# Patient Record
Sex: Male | Born: 1955 | Race: White | Hispanic: No | Marital: Married | Smoking: Never smoker
Health system: Southern US, Community
[De-identification: ages and names within clinical notes are randomized; demographics above are authoritative.]

## PROBLEM LIST (undated history)

## (undated) DIAGNOSIS — R0602 Shortness of breath: Secondary | ICD-10-CM

## (undated) DIAGNOSIS — G43909 Migraine, unspecified, not intractable, without status migrainosus: Secondary | ICD-10-CM

## (undated) DIAGNOSIS — F4310 Post-traumatic stress disorder, unspecified: Secondary | ICD-10-CM

## (undated) DIAGNOSIS — F419 Anxiety disorder, unspecified: Secondary | ICD-10-CM

## (undated) DIAGNOSIS — S0990XA Unspecified injury of head, initial encounter: Secondary | ICD-10-CM

## (undated) DIAGNOSIS — M1990 Unspecified osteoarthritis, unspecified site: Secondary | ICD-10-CM

## (undated) DIAGNOSIS — I951 Orthostatic hypotension: Secondary | ICD-10-CM

## (undated) DIAGNOSIS — M4800 Spinal stenosis, site unspecified: Secondary | ICD-10-CM

## (undated) DIAGNOSIS — F32A Depression, unspecified: Secondary | ICD-10-CM

## (undated) DIAGNOSIS — G8929 Other chronic pain: Secondary | ICD-10-CM

## (undated) DIAGNOSIS — F329 Major depressive disorder, single episode, unspecified: Secondary | ICD-10-CM

## (undated) DIAGNOSIS — K589 Irritable bowel syndrome without diarrhea: Secondary | ICD-10-CM

## (undated) DIAGNOSIS — R55 Syncope and collapse: Secondary | ICD-10-CM

## (undated) DIAGNOSIS — R251 Tremor, unspecified: Secondary | ICD-10-CM

## (undated) DIAGNOSIS — Z9289 Personal history of other medical treatment: Secondary | ICD-10-CM

## (undated) DIAGNOSIS — H409 Unspecified glaucoma: Secondary | ICD-10-CM

## (undated) DIAGNOSIS — T7840XA Allergy, unspecified, initial encounter: Secondary | ICD-10-CM

## (undated) DIAGNOSIS — E785 Hyperlipidemia, unspecified: Secondary | ICD-10-CM

## (undated) DIAGNOSIS — K219 Gastro-esophageal reflux disease without esophagitis: Secondary | ICD-10-CM

## (undated) DIAGNOSIS — T4145XA Adverse effect of unspecified anesthetic, initial encounter: Secondary | ICD-10-CM

## (undated) DIAGNOSIS — M797 Fibromyalgia: Secondary | ICD-10-CM

## (undated) DIAGNOSIS — T8859XA Other complications of anesthesia, initial encounter: Secondary | ICD-10-CM

## (undated) HISTORY — PX: TRIGGER FINGER RELEASE: SHX641

## (undated) HISTORY — PX: CLOSED REDUCTION SHOULDER DISLOCATION: SUR242

## (undated) HISTORY — PX: LUMBAR LAMINECTOMY: SHX95

## (undated) HISTORY — DX: Unspecified glaucoma: H40.9

## (undated) HISTORY — DX: Depression, unspecified: F32.A

## (undated) HISTORY — PX: TONSILLECTOMY: SUR1361

## (undated) HISTORY — DX: Syncope and collapse: R55

## (undated) HISTORY — PX: FINGER SURGERY: SHX640

## (undated) HISTORY — DX: Allergy, unspecified, initial encounter: T78.40XA

## (undated) HISTORY — DX: Anxiety disorder, unspecified: F41.9

## (undated) HISTORY — DX: Gastro-esophageal reflux disease without esophagitis: K21.9

## (undated) HISTORY — PX: BACK SURGERY: SHX140

## (undated) HISTORY — DX: Migraine, unspecified, not intractable, without status migrainosus: G43.909

## (undated) HISTORY — DX: Other chronic pain: G89.29

## (undated) HISTORY — DX: Hyperlipidemia, unspecified: E78.5

## (undated) HISTORY — DX: Unspecified injury of head, initial encounter: S09.90XA

## (undated) HISTORY — DX: Major depressive disorder, single episode, unspecified: F32.9

---

## 1981-10-03 HISTORY — PX: HAND NERVE REPAIR: SHX1728

## 1998-01-16 ENCOUNTER — Emergency Department (HOSPITAL_COMMUNITY): Admission: EM | Admit: 1998-01-16 | Discharge: 1998-01-16 | Payer: Self-pay | Admitting: Emergency Medicine

## 1999-04-12 ENCOUNTER — Ambulatory Visit (HOSPITAL_COMMUNITY): Admission: RE | Admit: 1999-04-12 | Discharge: 1999-04-12 | Payer: Self-pay | Admitting: Internal Medicine

## 2001-02-06 ENCOUNTER — Encounter: Admission: RE | Admit: 2001-02-06 | Discharge: 2001-05-07 | Payer: Self-pay | Admitting: Family Medicine

## 2006-10-17 ENCOUNTER — Ambulatory Visit (HOSPITAL_BASED_OUTPATIENT_CLINIC_OR_DEPARTMENT_OTHER): Admission: RE | Admit: 2006-10-17 | Discharge: 2006-10-17 | Payer: Self-pay | Admitting: Orthopedic Surgery

## 2007-07-03 ENCOUNTER — Encounter: Admission: RE | Admit: 2007-07-03 | Discharge: 2007-07-03 | Payer: Self-pay | Admitting: Family Medicine

## 2008-01-18 ENCOUNTER — Ambulatory Visit (HOSPITAL_BASED_OUTPATIENT_CLINIC_OR_DEPARTMENT_OTHER): Admission: RE | Admit: 2008-01-18 | Discharge: 2008-01-18 | Payer: Self-pay | Admitting: Orthopedic Surgery

## 2008-12-25 ENCOUNTER — Encounter: Payer: Self-pay | Admitting: Internal Medicine

## 2008-12-25 ENCOUNTER — Ambulatory Visit: Payer: Self-pay | Admitting: Internal Medicine

## 2008-12-25 DIAGNOSIS — Z91040 Latex allergy status: Secondary | ICD-10-CM

## 2008-12-31 ENCOUNTER — Ambulatory Visit: Payer: Self-pay

## 2008-12-31 ENCOUNTER — Encounter: Payer: Self-pay | Admitting: Internal Medicine

## 2009-01-12 ENCOUNTER — Telehealth (INDEPENDENT_AMBULATORY_CARE_PROVIDER_SITE_OTHER): Payer: Self-pay | Admitting: *Deleted

## 2009-01-26 DIAGNOSIS — H40009 Preglaucoma, unspecified, unspecified eye: Secondary | ICD-10-CM | POA: Insufficient documentation

## 2009-01-26 DIAGNOSIS — R519 Headache, unspecified: Secondary | ICD-10-CM | POA: Insufficient documentation

## 2009-01-26 DIAGNOSIS — F411 Generalized anxiety disorder: Secondary | ICD-10-CM | POA: Insufficient documentation

## 2009-01-26 DIAGNOSIS — R42 Dizziness and giddiness: Secondary | ICD-10-CM

## 2009-01-26 DIAGNOSIS — F329 Major depressive disorder, single episode, unspecified: Secondary | ICD-10-CM

## 2009-01-26 DIAGNOSIS — R51 Headache: Secondary | ICD-10-CM

## 2009-01-26 DIAGNOSIS — R0602 Shortness of breath: Secondary | ICD-10-CM | POA: Insufficient documentation

## 2009-01-26 DIAGNOSIS — F3289 Other specified depressive episodes: Secondary | ICD-10-CM | POA: Insufficient documentation

## 2009-01-26 DIAGNOSIS — R55 Syncope and collapse: Secondary | ICD-10-CM

## 2009-01-26 DIAGNOSIS — Z8679 Personal history of other diseases of the circulatory system: Secondary | ICD-10-CM

## 2010-02-23 ENCOUNTER — Telehealth (INDEPENDENT_AMBULATORY_CARE_PROVIDER_SITE_OTHER): Payer: Self-pay | Admitting: *Deleted

## 2010-03-23 ENCOUNTER — Ambulatory Visit (HOSPITAL_BASED_OUTPATIENT_CLINIC_OR_DEPARTMENT_OTHER): Admission: RE | Admit: 2010-03-23 | Discharge: 2010-03-24 | Payer: Self-pay | Admitting: Orthopedic Surgery

## 2010-05-01 ENCOUNTER — Emergency Department (HOSPITAL_COMMUNITY): Admission: EM | Admit: 2010-05-01 | Discharge: 2010-05-01 | Payer: Self-pay | Admitting: Emergency Medicine

## 2010-11-04 NOTE — Progress Notes (Signed)
  Request Recieved from [facility] sent to [facility]  Feb 23, 2010 9:03 AM

## 2010-12-18 LAB — CBC
MCHC: 34.7 g/dL (ref 30.0–36.0)
Platelets: 175 10*3/uL (ref 150–400)
RDW: 13.1 % (ref 11.5–15.5)
WBC: 6.9 10*3/uL (ref 4.0–10.5)

## 2010-12-18 LAB — DIFFERENTIAL
Basophils Absolute: 0 10*3/uL (ref 0.0–0.1)
Basophils Relative: 1 % (ref 0–1)
Eosinophils Relative: 2 % (ref 0–5)
Lymphocytes Relative: 28 % (ref 12–46)
Neutro Abs: 4.2 10*3/uL (ref 1.7–7.7)

## 2010-12-18 LAB — ETHANOL: Alcohol, Ethyl (B): <5 mg/dL (ref 0–10)

## 2010-12-18 LAB — BASIC METABOLIC PANEL
BUN: 16 mg/dL (ref 6–23)
Creatinine, Ser: 1.21 mg/dL (ref 0.4–1.5)
GFR calc non Af Amer: >60 mL/min (ref >60)
Glucose, Bld: 95 mg/dL (ref 70–99)

## 2010-12-18 LAB — RAPID URINE DRUG SCREEN, HOSP PERFORMED
Cocaine: NOT DETECTED
Opiates: NOT DETECTED
Tetrahydrocannabinol: NOT DETECTED

## 2010-12-18 LAB — TRICYCLICS SCREEN, URINE: TCA Scrn: NOT DETECTED

## 2010-12-18 LAB — GLUCOSE, CAPILLARY: Glucose-Capillary: 104 mg/dL — ABNORMAL HIGH (ref 70–99)

## 2011-01-28 ENCOUNTER — Encounter (INDEPENDENT_AMBULATORY_CARE_PROVIDER_SITE_OTHER): Payer: PRIVATE HEALTH INSURANCE | Admitting: Internal Medicine

## 2011-01-28 DIAGNOSIS — G43909 Migraine, unspecified, not intractable, without status migrainosus: Secondary | ICD-10-CM

## 2011-01-28 DIAGNOSIS — E785 Hyperlipidemia, unspecified: Secondary | ICD-10-CM

## 2011-01-28 DIAGNOSIS — F411 Generalized anxiety disorder: Secondary | ICD-10-CM

## 2011-02-15 NOTE — Letter (Signed)
December 25, 2008    Genene Churn. Love, M.D.  [address]  [address]  [place], [place] [address]   RE:  PHIL, MICHELS  MRN:  [id number]  /  DOB:  Aug 12, 1956   Dear Fayrene Fearing:   I had the opportunity to see Kerri Asche today for a fairly long, but  not particularly useful period of time.  I have apparently has seen him  about 10 years ago, at which time he underwent tilt table testing.  Unfortunately, none of those records were available.  He came in today  with a 2 page sheet of condition, quality of life, medications,  physicians, surgeries, which I am sure you are familiar with.  He  describes his major debility has been related to chronic complex  migraines, which can last days and days at a time.  He and his wife  describes him as being movable and ambulatory during these episodes, but  without awareness of what is happening.  The first question that they  had for me was whether he could have an ultrasound to see if he might  have a PFO that might contribute to his migraines.   I told them that I was unsure of the association, but there are some who  believed and others were not so sure, but doing the bubble study, echo  was a reasonable thing.   He also described dyspnea, which he relates back about 15 years when he  started on SSRI therapy, at which time he put on 60 pounds, which he has  not been able to lose.   He describes himself as being chronically dizzy, this is aggravated by  standing mostly from sitting position, but not from lying.  It can also  be aggravated by extension of his neck.  It is frequently accompanied by  the need to relieve bowels and bladder, and then is followed by these  complex migraine that last 4 or 5 days.   PHYSICAL EXAMINATION:  VITAL SIGNS:  His blood pressure is 121/82 with a  pulse of 81 without orthostatic change.  He did had some dizziness, as  he stood up.  His blood pressure went from 121-114, and his pulse from  81-92.  HEENT:   Demonstrated no icterus or xanthoma.  NECK:  His neck veins were flat.  HEART:  His heart sounds were regular.  He has to move appropriately.  ABDOMEN:  Soft.  EXTREMITIES:  Without edema.   MEDICATIONS:  Included  1. Effexor 150.  2. Zoloft 50.  3. Klonopin 1.2 mg in the morning and 3.6 in the afternoon.,  4. Relpax.  5. Reglan 10 mg 3 times a day.  6. Antiviral was discontinued just a couple of days ago for underlying      cause, which I did not explore.   Arren, as you might gather from the introduction of the note, it was a  very difficult history to take, as for each question that I proffer to  Mr. Majka there was a long series of tangential remarks.   I told him, that I did not really understand the idea of complex  migraines and I would get his tilt table test results and somebody  smarter than I would have to try and see if there is an association  between these 2 symptom complexes.  I mentioned that Rickard Patience down  at [facility], as an autonomic neurologist might be of value in this regard.  She may  also have some insight into specific autonomic nervous system  pathology that might help created more unifying diagnosis.  I do not  know to what degree neuropsychiatric disease has a major role in Mr.  Willems symptoms.   Thanks very much for allowing me to see him.  We will forward the  results of his echo to you.    Sincerely,      Duke Salvia, MD, [facility]  Electronically Signed    SCK/MedQ  DD: 12/25/2008  DT: 12/26/2008  Job #: [id number]

## 2011-02-15 NOTE — Op Note (Signed)
Carlos Mccall, Carlos Mccall               ACCOUNT NO.:  [id number]   MEDICAL RECORD NO.:  [id number]          PATIENT TYPE:  AMB   LOCATION:  DSC                          FACILITY:  [facility]   PHYSICIAN:  Katy Fitch. Sypher, M.D. DATE OF BIRTH:  [birth year]   DATE OF PROCEDURE:  DATE OF DISCHARGE:                               OPERATIVE REPORT   PREOPERATIVE DIAGNOSIS:  Locked right trigger thumb.   POSTOPERATIVE DIAGNOSIS:  Locked right trigger thumb.   OPERATION:  Release of right thumb A1 pulley.   OPERATING SURGEON:  Katy Fitch. Sypher, MD   ASSISTANT:  None.   ANESTHESIA:  2% lidocaine, metacarpal head level block, right thumb  supplemented by IV sedation.   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  He is referred to the courtesy of  Dr. Talmadge Coventry for evaluation and management of a locked trigger  thumb.   He has failed nonoperative measures.   After informed consent, he was brought to the operating room at this  time for release of his right thumb A1 pulley.   PROCEDURE:  Carlos Mccall is brought to the operating room and placed in  the supine position on the operating table.  After review of his  multiple allergies and his proper site identification protocol, the  right arm was prepped with Betadine soap and solution and sterilely  draped.  A pneumatic tourniquet was brought about through right  brachium.   Upon exsanguination of the right arm with an Esmarch bandage, arterial  tourniquet was inflated 240 mmHg due to mildly elevated systolic  hypertension.   Lidocaine 2% was infiltrated into the path of the intended incision  overlying the right thumb A1 pulley.  After few moments, anesthesia was  excellent.  Procedure commenced with short transverse incision directly  over the palpably thickened A1 pulley.  Subcutaneous tissues were  carefully divided  taking care to identify and gently retract the radial  proper digital nerve.   The pulley was cleared with a Freer followed by placement of skin hooks  and a blunt Ragnell retractor.   The A1 pulley was split with scalpel and scissors.  Thereafter, free  range of motion of the IP joint was recovered.   The wound was repaired with mattress suture of 5-0 nylon x3.  There were  no apparent complications.   Carlos Mccall is dressed with Xeroflo sterile gauze and Ace wrap.  The  tourniquet was released with immediate capillary refill to all fingers  and the thumb.   AFTERCARE:  Carlos Mccall is provided a prescription for Darvocet-N 100  one p.o. q.4-6 h. p.r.n. pain, 20 tablets without refill.   I will see him back for followup in our office in 1 week for suture  removal and advancement to an exercise program.      Katy Fitch. Sypher, M.D.  Electronically Signed     RVS/MEDQ  D:  01/18/2008  T:  01/19/2008  Job:  [id number]   cc:  Suszanne Conners, M.D.

## 2011-02-18 NOTE — Op Note (Signed)
NAMETOBENNA, NEEDS               ACCOUNT NO.:  [id number]   MEDICAL RECORD NO.:  [id number]          PATIENT TYPE:  AMB   LOCATION:  DSC                          FACILITY:  [facility]   PHYSICIAN:  Katy Fitch. Sypher, M.D. DATE OF BIRTH:  [birth year]   DATE OF PROCEDURE:  10/17/2006  DATE OF DISCHARGE:                               OPERATIVE REPORT   PREOPERATIVE DIAGNOSIS:  Status post laceration of left thumb extensor  pollicis longus overlying proximal phalanx with a 40-degree extensor lag  of left thumb interphalangeal joint.   POSTOPERATIVE DIAGNOSIS:  Status post laceration of left thumb extensor  pollicis longus overlying proximal phalanx with a 40-degree extensor lag  of left thumb interphalangeal joint.   OPERATION:  Secondary repair of left thumb extensor pollicis longus  utilizing a 0.045-inch and 0.035-inch Kirschner wire to secure the left  thumb interphalangeal joint in 20 degrees of hyperextension.   OPERATING SURGEON:  Katy Fitch. Sypher, MD   ASSISTANT:  Annye Rusk, PA-C.   ANESTHESIA:  Left infraclavicular block.   SUPERVISING ANESTHESIOLOGIST:  Miron Marxen. Krista Blue, MD   INDICATIONS:  Carlos Mccall is a 55 year old gentleman referred through  the courtesy of Dr. Ellamae Sia of the [facility]  following a laceration of the dorsal aspect of his left thumb proximal  phalanx.  Dr. Merla Riches identified the extensor pollicis longus  laceration and cleaned and repaired Carlos Mccall's wound.  He referred  him for an upper extremity orthopedic consult.   Clinical examination revealed the extensor lag at the IP joint.  Dr.  Netta Corrigan notes confirm the presence of an extensor pollicis longus  laceration.   Carlos Mccall is scheduled for delayed primary repair of his tendon at  this time.   PROCEDURE:  Carlos Mccall is brought to the operating room and placed in  supine position upon the operating table.   Following an anesthesia consult by Dr.  Krista Blue, a left infraclavicular  block was placed without complication.   The left arm was then prepped with Betadine soap and solution and  sterilely draped.  A pneumatic tourniquet was applied to the proximal  left brachium.   Following exsanguination of the left arm with an Esmarch bandage, the  arterial tourniquet was inflated to 240 mmHg.   Procedure commenced with extension of the traumatic wound with a Brunner-  type incision distally.   Subcutaneous tissues were carefully divided, taking care to spare the  epitenon.   The extensor pollicis longus was completely lacerated and retracted  approximately 15 mm.   The tendon was captured proximally and a dural hook was used to bring  the tendon distally.   The tendon was repaired with 3 core-grasping sutures of 3-0 Ethilon.  This anatomically repaired the tendon and no finishing suture was  necessary.  The periosteum was intact.   The interphalangeal joint was secured at 20-degree hyperextension with a  0.045-inch Kirschner wire and a 0.035-inch Kirschner wire driven across  the joint space.   AP lateral C-arm images confirmed satisfactory position of the Kirschner  wires and joint.   The wound  was then repaired with interrupted sutures of 5-0 nylon.   A compressive dressing was applied with a forearm-based thumb spica  splint.  There were no apparent complications.   For aftercare, Carlos Mccall is provided Dilaudid 2 mg one or two tablets  p.o. q.4-6 h. p.r.n. pain, 20 tablets without refill, also Keflex 500 mg  one p.o. q.8 h. x48 hours.      Katy Fitch Sypher, M.D.  Electronically Signed     RVS/MEDQ  D:  10/17/2006  T:  10/18/2006  Job:  [id number]   cc:   Harrel Lemon. Merla Riches, M.D.

## 2011-05-17 ENCOUNTER — Other Ambulatory Visit: Payer: Self-pay | Admitting: Internal Medicine

## 2011-05-17 NOTE — Telephone Encounter (Signed)
Last refill was 04/06/11. May not be compliant daily with Tricor. Next OV 6 month recheck due 10/12

## 2011-06-28 LAB — POCT HEMOGLOBIN-HEMACUE: Hemoglobin: 15.4

## 2011-08-02 ENCOUNTER — Encounter: Payer: Self-pay | Admitting: Internal Medicine

## 2011-08-02 ENCOUNTER — Other Ambulatory Visit: Payer: PRIVATE HEALTH INSURANCE | Admitting: Internal Medicine

## 2011-08-04 ENCOUNTER — Ambulatory Visit (INDEPENDENT_AMBULATORY_CARE_PROVIDER_SITE_OTHER): Payer: PRIVATE HEALTH INSURANCE | Admitting: Internal Medicine

## 2011-08-04 ENCOUNTER — Encounter: Payer: Self-pay | Admitting: Internal Medicine

## 2011-08-04 VITALS — BP 122/84 | HR 76 | Temp 98.2°F | Ht 67.0 in | Wt 168.0 lb

## 2011-08-04 DIAGNOSIS — F603 Borderline personality disorder: Secondary | ICD-10-CM

## 2011-08-04 DIAGNOSIS — E7800 Pure hypercholesterolemia, unspecified: Secondary | ICD-10-CM

## 2011-08-04 DIAGNOSIS — F329 Major depressive disorder, single episode, unspecified: Secondary | ICD-10-CM

## 2011-08-04 DIAGNOSIS — Z8669 Personal history of other diseases of the nervous system and sense organs: Secondary | ICD-10-CM

## 2011-08-04 DIAGNOSIS — F419 Anxiety disorder, unspecified: Secondary | ICD-10-CM

## 2011-08-05 LAB — LIPID PANEL
Cholesterol: 227 mg/dL — ABNORMAL HIGH (ref 0–200)
Triglycerides: 184 mg/dL — ABNORMAL HIGH (ref <150)
VLDL: 37 mg/dL (ref 0–40)
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
## 2011-08-07 ENCOUNTER — Encounter: Payer: Self-pay | Admitting: Internal Medicine

## 2011-09-02 ENCOUNTER — Ambulatory Visit: Payer: PRIVATE HEALTH INSURANCE | Admitting: Internal Medicine

## 2012-08-03 ENCOUNTER — Other Ambulatory Visit: Payer: Self-pay | Admitting: Orthopedic Surgery

## 2012-08-03 ENCOUNTER — Emergency Department (HOSPITAL_COMMUNITY): Payer: Medicare Other

## 2012-08-03 ENCOUNTER — Emergency Department (HOSPITAL_COMMUNITY)
Admission: EM | Admit: 2012-08-03 | Discharge: 2012-08-03 | Disposition: A | Discharge status: Home / Self Care | Payer: Medicare Other | Attending: Emergency Medicine | Admitting: Emergency Medicine

## 2012-08-03 ENCOUNTER — Encounter (HOSPITAL_COMMUNITY): Payer: Self-pay | Admitting: *Deleted

## 2012-08-03 DIAGNOSIS — S52501A Unspecified fracture of the lower end of right radius, initial encounter for closed fracture: Secondary | ICD-10-CM

## 2012-08-03 DIAGNOSIS — S50311A Abrasion of right elbow, initial encounter: Secondary | ICD-10-CM

## 2012-08-03 DIAGNOSIS — E785 Hyperlipidemia, unspecified: Secondary | ICD-10-CM | POA: Insufficient documentation

## 2012-08-03 DIAGNOSIS — Z7982 Long term (current) use of aspirin: Secondary | ICD-10-CM | POA: Insufficient documentation

## 2012-08-03 DIAGNOSIS — S80211A Abrasion, right knee, initial encounter: Secondary | ICD-10-CM

## 2012-08-03 DIAGNOSIS — Z79899 Other long term (current) drug therapy: Secondary | ICD-10-CM | POA: Insufficient documentation

## 2012-08-03 DIAGNOSIS — IMO0001 Reserved for inherently not codable concepts without codable children: Secondary | ICD-10-CM | POA: Insufficient documentation

## 2012-08-03 DIAGNOSIS — F411 Generalized anxiety disorder: Secondary | ICD-10-CM | POA: Insufficient documentation

## 2012-08-03 DIAGNOSIS — W108XXA Fall (on) (from) other stairs and steps, initial encounter: Secondary | ICD-10-CM | POA: Insufficient documentation

## 2012-08-03 DIAGNOSIS — F3289 Other specified depressive episodes: Secondary | ICD-10-CM | POA: Insufficient documentation

## 2012-08-03 DIAGNOSIS — S52509A Unspecified fracture of the lower end of unspecified radius, initial encounter for closed fracture: Secondary | ICD-10-CM | POA: Insufficient documentation

## 2012-08-03 DIAGNOSIS — Z8669 Personal history of other diseases of the nervous system and sense organs: Secondary | ICD-10-CM | POA: Insufficient documentation

## 2012-08-03 DIAGNOSIS — F4310 Post-traumatic stress disorder, unspecified: Secondary | ICD-10-CM | POA: Insufficient documentation

## 2012-08-03 DIAGNOSIS — IMO0002 Reserved for concepts with insufficient information to code with codable children: Secondary | ICD-10-CM | POA: Insufficient documentation

## 2012-08-03 DIAGNOSIS — Y9301 Activity, walking, marching and hiking: Secondary | ICD-10-CM | POA: Insufficient documentation

## 2012-08-03 DIAGNOSIS — K219 Gastro-esophageal reflux disease without esophagitis: Secondary | ICD-10-CM | POA: Insufficient documentation

## 2012-08-03 DIAGNOSIS — K589 Irritable bowel syndrome without diarrhea: Secondary | ICD-10-CM | POA: Insufficient documentation

## 2012-08-03 DIAGNOSIS — F329 Major depressive disorder, single episode, unspecified: Secondary | ICD-10-CM | POA: Insufficient documentation

## 2012-08-03 DIAGNOSIS — Z8739 Personal history of other diseases of the musculoskeletal system and connective tissue: Secondary | ICD-10-CM | POA: Insufficient documentation

## 2012-08-03 DIAGNOSIS — Y929 Unspecified place or not applicable: Secondary | ICD-10-CM | POA: Insufficient documentation

## 2012-08-03 HISTORY — DX: Fibromyalgia: M79.7

## 2012-08-03 HISTORY — DX: Post-traumatic stress disorder, unspecified: F43.10

## 2012-08-03 HISTORY — DX: Tremor, unspecified: R25.1

## 2012-08-03 HISTORY — DX: Spinal stenosis, site unspecified: M48.00

## 2012-08-03 HISTORY — DX: Irritable bowel syndrome, unspecified: K58.9

## 2012-08-03 MED ORDER — HYDROMORPHONE HCL PF 1 MG/ML IJ SOLN
2.0000 mg | Freq: Once | INTRAMUSCULAR | Status: AC
  Administered 2012-08-03: 2 mg via INTRAMUSCULAR
  Filled 2012-08-03: qty 2

## 2012-08-03 MED ORDER — HYDROMORPHONE HCL 2 MG PO TABS: 2.0000 mg | ORAL_TABLET | ORAL | Status: DC | PRN

## 2012-08-03 MED ORDER — ONDANSETRON 4 MG PO TBDP
8.0000 mg | ORAL_TABLET | Freq: Once | ORAL | Status: AC
  Administered 2012-08-03: 8 mg via ORAL
  Filled 2012-08-03 (×2): qty 1

## 2012-08-03 NOTE — ED Notes (Signed)
The pt fell approx tonight  Down some steps at home.  He has multiple abrasions rt ear rt elbow rt hand rt knee and he also has a deformity of the rt wrist.  Pain when flexing his fingers.  Bruising to the rt thumb

## 2012-08-03 NOTE — ED Notes (Signed)
EDP Carlos Mccall at bedside to update patient and family on plan of care at this time.

## 2012-08-03 NOTE — ED Notes (Signed)
RN Minerva Areola informed of patient's VS.

## 2012-08-03 NOTE — ED Provider Notes (Signed)
History     CSN: [id number]  Arrival date & time 08/03/12  0030   None     Chief Complaint  Patient presents with  . Wrist Injury    (Consider location/radiation/quality/duration/timing/severity/associated sxs/prior treatment) Patient is a 56 y.o. male presenting with wrist injury. The history is provided by the patient.  Wrist Injury   He either tripped or lost his balance and fell down some concrete stairs. He injured his right wrist and also hit his head and, right knee, and right elbow. He states that he has frequent falls because of medication that he takes. Pain is moderately severe although he has difficulty in bending a number on it. He states he's had multiple concussions. There's been no nausea or vomiting. He is now starting to complain of pain in his neck as well.  Past Medical History  Diagnosis Date  . Head trauma     history of multiple  . Migraines   . Depression   . Anxiety   . GERD (gastroesophageal reflux disease)   . Allergy   . Hyperlipidemia   . Fibromyalgia   . Post traumatic stress disorder (PTSD)   . Migraines   . Spinal stenosis   . IBS (irritable bowel syndrome)   . Anxiety   . Tremors of nervous system     Past Surgical History  Procedure Date  . Lumbar laminectomy   . Trigger thumb release   . Tonsillectomy   . Closed reduction shoulder dislocation     Family History  Problem Relation Age of Onset  . Adopted: Yes    History  Substance Use Topics  . Smoking status: Never Smoker   . Smokeless tobacco: Never Used  . Alcohol Use: No      Review of Systems  All other systems reviewed and are negative.    Allergies  Codeine; Latex; Shellfish allergy; and Tetracyclines & related  Home Medications   Current Outpatient Rx  Name Route Sig Dispense Refill  . ASPIRIN 81 MG PO TABS Oral Take 81 mg by mouth daily.      Marland Kitchen CLONAZEPAM 2 MG PO TABS Oral Take 2 mg by mouth 2 (two) times daily as needed.      Marland Kitchen ELETRIPTAN  HYDROBROMIDE 40 MG PO TABS Oral One tablet by mouth as needed for migraine headache.  If the headache improves and then returns, dose may be repeated after 2 hours have elapsed since first dose (do not exceed 80 mg per day). may repeat in 2 hours if necessary     . EPINEPHRINE 0.15 MG/0.3ML IJ DEVI Intramuscular Inject 0.15 mg into the muscle as needed.      Marland Kitchen METOCLOPRAMIDE HCL 10 MG PO TABS Oral Take 10 mg by mouth 4 (four) times daily.      . SERTRALINE HCL 25 MG PO TABS Oral Take 25 mg by mouth daily.      . TRICOR 145 MG PO TABS  TAKE 1 TABLET EVERY DAY 30 tablet 5  . VENLAFAXINE HCL ER 150 MG PO CP24 Oral Take 150 mg by mouth daily.        BP 139/84  Pulse 124  Temp 98.3 F (36.8 C) (Oral)  Resp 16  SpO2 96%  Physical Exam  Nursing note and vitals reviewed. 56 year old male, resting comfortably and in no acute distress. Vital signs are normal. Oxygen saturation is 96%, which is normal. Head is normocephalic. Minor abrasion is seen in the right parietal area and  there is a minor abrasion over the helix of the right ear. PERRLA, EOMI. Oropharynx is clear. Neck is mildly tender without adenopathy or JVD. Back is nontender and there is no CVA tenderness. Lungs are clear without rales, wheezes, or rhonchi. Chest is nontender. Heart has regular rate and rhythm without murmur. Abdomen is soft, flat, nontender without masses or hepatosplenomegaly and peristalsis is normoactive. Extremities: Swelling and deformity are present over the right wrist consistent with a Colles' fracture. There is ecchymosis and tenderness over the right thumb. An abrasion is present over the right elbow and right knee. Full range of motion is present of all joints without pain with the exception of the right wrist. Neurovascular exam is intact with prompt capillary refill normal sensation. Skin is warm and dry without rash. Neurologic: Mental status is normal, cranial nerves are intact, there are no motor or  sensory deficits.   ED Course  SPLINT APPLICATION Date/Time: 08/03/2012 2:00 AM Performed by: Dione Booze Authorized by: Preston Fleeting, Waylan Busta Consent: Verbal consent obtained. Written consent not obtained. Risks and benefits: risks, benefits and alternatives were discussed Consent given by: patient Patient understanding: patient states understanding of the procedure being performed Patient consent: the patient's understanding of the procedure matches consent given Procedure consent: procedure consent matches procedure scheduled Relevant documents: relevant documents present and verified Test results: test results available and properly labeled Site marked: the operative site was marked Imaging studies: imaging studies available Required items: required blood products, implants, devices, and special equipment available Patient identity confirmed: verbally with patient and arm band Time out: Immediately prior to procedure a "time out" was called to verify the correct patient, procedure, equipment, support staff and site/side marked as required. Location details: right wrist Splint type: sugar tong Supplies used: cotton padding, Ortho-Glass and elastic bandage Post-procedure: The splinted body part was neurovascularly unchanged following the procedure. Patient tolerance: Patient tolerated the procedure well with no immediate complications. Comments: Splint was applied by orthopedic technician. I evaluated the patient's neurovascular status following splint application.   (including critical care time)  Labs Reviewed - No data to display Dg Wrist Complete Right  08/03/2012  *RADIOLOGY REPORT*  Clinical Data: Fall, pain.  RIGHT WRIST - COMPLETE 3+ VIEW  Comparison: None.  Findings: The patient has an impacted and dorsally angulated fracture of the distal radial metaphysis.  Ulnar styloid fracture is also identified.  No other acute bony abnormality is seen.  Soft tissue swelling is noted.   IMPRESSION: Impacted and dorsally angulated distal radius fracture.  Ulnar styloid fracture noted.   Original Report Authenticated By: Holley Dexter, M.D.      1. Closed fracture of right distal radius and ulna   2. Abrasion of right elbow   3. Abrasion of right knee       MDM  Probable fracture of the right wrist. Questionable fracture of the right thumb. Because of history of concussions, head CT will be obtained although there is only minimal evidence of head trauma. There is no swelling or pain on range of motion of the knee and elbow, so x-rays were not obtained. Of note, he is a patient of Dr. Teressa Senter, who is an orthopedic hand specialist.  Head CT and hand x-ray unremarkable. He is resting comfortably after being splinted. He is discharged with a prescription for hy codeine or hydrocodone or oxycodone.dromorphone for pain because he states he cannot take     Dione Booze, MD 08/03/12 [phone]

## 2012-08-03 NOTE — Progress Notes (Signed)
Orthopedic Tech Progress Note Patient Details:  MEHAR LANGLINAIS Sep 29, 1956 [id number]  Ortho Devices Type of Ortho Device: Sugartong splint;Sling immobilizer   Haskell Flirt 08/03/2012, 3:02 AM

## 2012-08-03 NOTE — ED Notes (Signed)
Pt tripped on pant leg and fell down stairs, rolling down stairs.  He heard his wrist crack (R wrist deformed), R knee and R elbow abraided.  Denies hitting head.  R ear bleeding.  Pt took muscle relaxant before he fell down stairs.  No loc.

## 2012-08-07 ENCOUNTER — Encounter (HOSPITAL_BASED_OUTPATIENT_CLINIC_OR_DEPARTMENT_OTHER): Payer: Self-pay | Admitting: *Deleted

## 2012-08-07 NOTE — Progress Notes (Signed)
Was in ER 08/03/12-fall down steps-injured rt wrist-shoulder-thumb-bruised rt knee No cardiac or resp problems Takes multiple supplements-to hold some till surg

## 2012-08-08 NOTE — H&P (Signed)
  Carlos Mccall is an 56 y.o. male.   Chief Complaint: c/o injury to right wrist  HPI: Bodie Crocco had a fall down stairs at home sustaining abrasions to his head and a significant injury to the right wrist.   He was scheduled for a biopsy of a mass of his right distal forearm on 08/09/12.  He was seen in the emergency department 08/02/12 where x-rays revealed a significantly impacted right distal radius fracture. He was properly splinted and referred for follow-up at this time.      Past Medical History  Diagnosis Date  . Head trauma     history of multiple  . Migraines   . Depression   . Anxiety   . GERD (gastroesophageal reflux disease)   . Allergy   . Hyperlipidemia   . Fibromyalgia   . Post traumatic stress disorder (PTSD)   . Migraines   . Spinal stenosis   . IBS (irritable bowel syndrome)   . Anxiety   . Tremors of nervous system     Past Surgical History  Procedure Date  . Lumbar laminectomy   . Trigger thumb release   . Tonsillectomy   . Closed reduction shoulder dislocation   . Thumb arthroscopy     both    Family History  Problem Relation Age of Onset  . Adopted: Yes   Social History:  reports that he has never smoked. He has never used smokeless tobacco. He reports that he does not drink alcohol or use illicit drugs.  Allergies:  Allergies  Allergen Reactions  . Codeine     REACTION: RASH/JITTERS  Hallucinations  . Latex Other (See Comments)    Only if patient wears it himself, causes rash  . Shellfish Allergy     migraine  . Tetracyclines & Related Nausea And Vomiting    headache    No prescriptions prior to admission    No results found for this or any previous visit (from the past 48 hour(s)).  No results found.   Pertinent items are noted in HPI.  Height 5\' 7"  (1.702 m), weight 76.204 kg (168 lb).  General appearance: alert Head: Normocephalic, without obvious abnormality Neck: supple, symmetrical, trachea midline Resp: clear to  auscultation bilaterally Cardio: regular rate and rhythm GI: normal findings: bowel sounds normal Extremities: On examination his splint is fitting well.  He has no edema. He has intact sensibility in the median, ulnar and radial distribution. He has had some shooting discomfort with finger motions typical for this injury acutely.   We reviewed in his films.  He has an impacted, shortened and possibly displaced distal radius fracture.     Pulses: 2+ and symmetric Skin: normal Neurologic: Grossly normal    Assessment/Plan Impression: Right distal radius fracture and cyst right distal wrist  Plan : To the OR for ORIF right distal radius fracture and excision of cyst right wrist.The procedure, risks,benefits and post-op course were discussed with the patient at length and they were in agreement with the plan.   DASNOIT,Jaquelin Meaney J 08/08/2012, 3:19 PM     H&P documentation: 08/09/2012  -History and Physical Reviewed  -Patient has been re-examined  -No change in the plan of care  Wyn Forster, MD

## 2012-08-09 ENCOUNTER — Encounter (HOSPITAL_BASED_OUTPATIENT_CLINIC_OR_DEPARTMENT_OTHER): Payer: Self-pay | Admitting: Certified Registered Nurse Anesthetist

## 2012-08-09 ENCOUNTER — Ambulatory Visit (HOSPITAL_BASED_OUTPATIENT_CLINIC_OR_DEPARTMENT_OTHER)
Admission: RE | Admit: 2012-08-09 | Discharge: 2012-08-09 | Disposition: A | Discharge status: Home / Self Care | Payer: Medicare Other | Source: Ambulatory Visit | Attending: Orthopedic Surgery | Admitting: Orthopedic Surgery

## 2012-08-09 ENCOUNTER — Encounter (HOSPITAL_BASED_OUTPATIENT_CLINIC_OR_DEPARTMENT_OTHER): Payer: Self-pay | Admitting: *Deleted

## 2012-08-09 ENCOUNTER — Encounter (HOSPITAL_BASED_OUTPATIENT_CLINIC_OR_DEPARTMENT_OTHER)
Admission: RE | Disposition: A | Discharge status: Home / Self Care | Payer: Self-pay | Source: Ambulatory Visit | Attending: Orthopedic Surgery

## 2012-08-09 ENCOUNTER — Ambulatory Visit (HOSPITAL_BASED_OUTPATIENT_CLINIC_OR_DEPARTMENT_OTHER): Payer: Medicare Other | Admitting: Certified Registered Nurse Anesthetist

## 2012-08-09 DIAGNOSIS — S52599A Other fractures of lower end of unspecified radius, initial encounter for closed fracture: Secondary | ICD-10-CM | POA: Insufficient documentation

## 2012-08-09 DIAGNOSIS — W108XXA Fall (on) (from) other stairs and steps, initial encounter: Secondary | ICD-10-CM | POA: Insufficient documentation

## 2012-08-09 DIAGNOSIS — M6740 Ganglion, unspecified site: Secondary | ICD-10-CM | POA: Insufficient documentation

## 2012-08-09 DIAGNOSIS — IMO0001 Reserved for inherently not codable concepts without codable children: Secondary | ICD-10-CM | POA: Insufficient documentation

## 2012-08-09 DIAGNOSIS — Y92009 Unspecified place in unspecified non-institutional (private) residence as the place of occurrence of the external cause: Secondary | ICD-10-CM | POA: Insufficient documentation

## 2012-08-09 HISTORY — PX: ORIF RADIAL FRACTURE: SHX5113

## 2012-08-09 HISTORY — PX: EAR CYST EXCISION: SHX22

## 2012-08-09 LAB — POCT HEMOGLOBIN-HEMACUE: Hemoglobin: 13.7 g/dL (ref 13.0–17.0)

## 2012-08-09 SURGERY — CYST REMOVAL
Anesthesia: General | Site: Wrist | Laterality: Right | Wound class: Clean

## 2012-08-09 MED ORDER — LACTATED RINGERS IV SOLN
INTRAVENOUS | Status: DC
  Administered 2012-08-09 (×2): via INTRAVENOUS

## 2012-08-09 MED ORDER — PROPOFOL 10 MG/ML IV BOLUS
INTRAVENOUS | Status: DC | PRN
  Administered 2012-08-09: 200 mg via INTRAVENOUS

## 2012-08-09 MED ORDER — ROPIVACAINE HCL 5 MG/ML IJ SOLN
INTRAMUSCULAR | Status: DC | PRN
  Administered 2012-08-09: 25 mL

## 2012-08-09 MED ORDER — OXYCODONE HCL 5 MG/5ML PO SOLN: 5.0000 mg | Freq: Once | ORAL | Status: DC | PRN

## 2012-08-09 MED ORDER — HYDROMORPHONE HCL PF 1 MG/ML IJ SOLN: 0.2500 mg | INTRAMUSCULAR | Status: DC | PRN

## 2012-08-09 MED ORDER — VANCOMYCIN HCL IN DEXTROSE 1-5 GM/200ML-% IV SOLN
1000.0000 mg | Freq: Once | INTRAVENOUS | Status: AC
  Administered 2012-08-09: 1000 mg via INTRAVENOUS

## 2012-08-09 MED ORDER — ONDANSETRON HCL 4 MG/2ML IJ SOLN
INTRAMUSCULAR | Status: DC | PRN
  Administered 2012-08-09: 4 mg via INTRAVENOUS

## 2012-08-09 MED ORDER — LIDOCAINE HCL (CARDIAC) 20 MG/ML IV SOLN
INTRAVENOUS | Status: DC | PRN
  Administered 2012-08-09: 60 mg via INTRAVENOUS

## 2012-08-09 MED ORDER — OXYCODONE HCL 5 MG PO TABS: 5.0000 mg | ORAL_TABLET | Freq: Once | ORAL | Status: DC | PRN

## 2012-08-09 MED ORDER — MIDAZOLAM HCL 2 MG/2ML IJ SOLN
1.0000 mg | INTRAMUSCULAR | Status: DC | PRN
  Administered 2012-08-09: 2 mg via INTRAVENOUS

## 2012-08-09 MED ORDER — CHLORHEXIDINE GLUCONATE 4 % EX LIQD: 60.0000 mL | Freq: Once | CUTANEOUS | Status: DC

## 2012-08-09 MED ORDER — ONDANSETRON HCL 4 MG/2ML IJ SOLN: 4.0000 mg | Freq: Once | INTRAMUSCULAR | Status: DC | PRN

## 2012-08-09 MED ORDER — HYDROMORPHONE HCL 2 MG PO TABS: ORAL_TABLET | ORAL | Status: DC

## 2012-08-09 MED ORDER — MIDAZOLAM HCL 2 MG/2ML IJ SOLN: 1.0000 mg | INTRAMUSCULAR | Status: DC | PRN

## 2012-08-09 MED ORDER — SULFAMETHOXAZOLE-TRIMETHOPRIM 800-160 MG PO TABS: 1.0000 | ORAL_TABLET | Freq: Two times a day (BID) | ORAL | Status: DC

## 2012-08-09 MED ORDER — MIDAZOLAM HCL 5 MG/5ML IJ SOLN
INTRAMUSCULAR | Status: DC | PRN
  Administered 2012-08-09: 1 mg via INTRAVENOUS

## 2012-08-09 MED ORDER — FENTANYL CITRATE 0.05 MG/ML IJ SOLN
100.0000 ug | Freq: Once | INTRAMUSCULAR | Status: AC
  Administered 2012-08-09: 100 ug via INTRAVENOUS

## 2012-08-09 MED ORDER — DEXAMETHASONE SODIUM PHOSPHATE 10 MG/ML IJ SOLN
INTRAMUSCULAR | Status: DC | PRN
  Administered 2012-08-09: 10 mg via INTRAVENOUS

## 2012-08-09 MED ORDER — FENTANYL CITRATE 0.05 MG/ML IJ SOLN: 50.0000 ug | INTRAMUSCULAR | Status: DC | PRN

## 2012-08-09 SURGICAL SUPPLY — 72 items
BAG DECANTER FOR FLEXI CONT (MISCELLANEOUS) IMPLANT
BANDAGE ADHESIVE 1X3 (GAUZE/BANDAGES/DRESSINGS) IMPLANT
BANDAGE ELASTIC 3 VELCRO ST LF (GAUZE/BANDAGES/DRESSINGS) ×4 IMPLANT
BANDAGE GAUZE ELAST BULKY 4 IN (GAUZE/BANDAGES/DRESSINGS) ×2 IMPLANT
BLADE MINI RND TIP GREEN BEAV (BLADE) ×2 IMPLANT
BLADE SURG 15 STRL LF DISP TIS (BLADE) ×2 IMPLANT
BLADE SURG 15 STRL SS (BLADE) ×2
BNDG COHESIVE 1X5 TAN STRL LF (GAUZE/BANDAGES/DRESSINGS) IMPLANT
BNDG ESMARK 4X9 LF (GAUZE/BANDAGES/DRESSINGS) ×2 IMPLANT
BRUSH SCRUB EZ PLAIN DRY (MISCELLANEOUS) IMPLANT
CANISTER SUCTION 1200CC (MISCELLANEOUS) ×2 IMPLANT
CLOTH BEACON ORANGE TIMEOUT ST (SAFETY) ×2 IMPLANT
CORDS BIPOLAR (ELECTRODE) ×2 IMPLANT
COVER MAYO STAND STRL (DRAPES) ×2 IMPLANT
COVER TABLE BACK 60X90 (DRAPES) ×2 IMPLANT
CUFF TOURNIQUET SINGLE 18IN (TOURNIQUET CUFF) ×2 IMPLANT
DECANTER SPIKE VIAL GLASS SM (MISCELLANEOUS) IMPLANT
DRAIN PENROSE 1/2X12 LTX STRL (WOUND CARE) IMPLANT
DRAIN PENROSE 1/4X12 LTX STRL (WOUND CARE) IMPLANT
DRAPE EXTREMITY T 121X128X90 (DRAPE) ×2 IMPLANT
DRAPE OEC MINIVIEW 54X84 (DRAPES) ×2 IMPLANT
DRAPE SURG 17X23 STRL (DRAPES) ×2 IMPLANT
GAUZE XEROFORM 1X8 LF (GAUZE/BANDAGES/DRESSINGS) IMPLANT
GLOVE BIOGEL M STRL SZ7.5 (GLOVE) IMPLANT
GLOVE ORTHO TXT STRL SZ7.5 (GLOVE) IMPLANT
GOWN PREVENTION PLUS XLARGE (GOWN DISPOSABLE) ×2 IMPLANT
GOWN PREVENTION PLUS XXLARGE (GOWN DISPOSABLE) ×4 IMPLANT
LOOP VESSEL MAXI BLUE (MISCELLANEOUS) IMPLANT
NEEDLE 27GAX1X1/2 (NEEDLE) IMPLANT
NEEDLE BLUNT 17GA (NEEDLE) ×2 IMPLANT
NS IRRIG 1000ML POUR BTL (IV SOLUTION) ×2 IMPLANT
PACK BASIN DAY SURGERY FS (CUSTOM PROCEDURE TRAY) ×2 IMPLANT
PAD CAST 3X4 CTTN HI CHSV (CAST SUPPLIES) ×2 IMPLANT
PADDING CAST ABS 4INX4YD NS (CAST SUPPLIES) ×1
PADDING CAST ABS COTTON 4X4 ST (CAST SUPPLIES) ×1 IMPLANT
PADDING CAST COTTON 3X4 STRL (CAST SUPPLIES) ×2
PEG FULLY THREADED 2.5X22MM (Peg) ×2 IMPLANT
PEG SUBCHONDRAL SMOOTH 2.0X18 (Peg) ×2 IMPLANT
PEG SUBCHONDRAL SMOOTH 2.0X20 (Peg) ×4 IMPLANT
PEG SUBCHONDRAL SMOOTH 2.0X22 (Peg) ×2 IMPLANT
PEG SUBCHONDRAL SMOOTH 2.0X24 (Peg) ×2 IMPLANT
PLATE STAN 24.4X59.5 RT (Plate) ×2 IMPLANT
SCREW BN 12X3.5XNS CORT TI (Screw) ×2 IMPLANT
SCREW CORT 3.5X10 LNG (Screw) ×2 IMPLANT
SCREW CORT 3.5X12 (Screw) ×2 IMPLANT
SCREW CORT 3.5X14 LNG (Screw) ×2 IMPLANT
SCREW PEG LOCK 2.5X20 (Peg) ×2 IMPLANT
SLEEVE SCD COMPRESS KNEE MED (MISCELLANEOUS) ×2 IMPLANT
SPLINT PLASTER CAST XFAST 3X15 (CAST SUPPLIES) ×10 IMPLANT
SPLINT PLASTER XTRA FASTSET 3X (CAST SUPPLIES) ×10
SPONGE GAUZE 4X4 12PLY (GAUZE/BANDAGES/DRESSINGS) ×2 IMPLANT
STOCKINETTE 4X48 STRL (DRAPES) ×2 IMPLANT
STRIP CLOSURE SKIN 1/2X4 (GAUZE/BANDAGES/DRESSINGS) ×2 IMPLANT
SUCTION FRAZIER TIP 10 FR DISP (SUCTIONS) ×2 IMPLANT
SUT ETHIBOND 3-0 V-5 (SUTURE) IMPLANT
SUT ETHILON 5 0 P 3 18 (SUTURE)
SUT MERSILENE 4 0 P 3 (SUTURE) IMPLANT
SUT NYLON ETHILON 5-0 P-3 1X18 (SUTURE) IMPLANT
SUT PROLENE 3 0 PS 2 (SUTURE) ×4 IMPLANT
SUT VIC AB 0 SH 27 (SUTURE) ×2 IMPLANT
SUT VIC AB 2-0 PS2 27 (SUTURE) IMPLANT
SUT VIC AB 3-0 FS2 27 (SUTURE) ×2 IMPLANT
SUT VIC AB 4-0 BRD 54 (SUTURE) IMPLANT
SYR 20CC LL (SYRINGE) IMPLANT
SYR 3ML 23GX1 SAFETY (SYRINGE) IMPLANT
SYR BULB 3OZ (MISCELLANEOUS) ×2 IMPLANT
SYR CONTROL 10ML LL (SYRINGE) IMPLANT
TOWEL OR 17X24 6PK STRL BLUE (TOWEL DISPOSABLE) ×4 IMPLANT
TRAY DSU PREP LF (CUSTOM PROCEDURE TRAY) IMPLANT
TUBE CONNECTING 20X1/4 (TUBING) ×2 IMPLANT
UNDERPAD 30X30 INCONTINENT (UNDERPADS AND DIAPERS) ×2 IMPLANT
WATER STERILE IRR 1000ML POUR (IV SOLUTION) IMPLANT

## 2012-08-09 NOTE — Anesthesia Postprocedure Evaluation (Signed)
Anesthesia Post Note  Patient: Carlos Mccall  Procedure(s) Performed: Procedure(s) (LRB): CYST REMOVAL (Right) OPEN REDUCTION INTERNAL FIXATION (ORIF) RADIAL FRACTURE (Right)  Anesthesia type: General  Patient location: PACU  Post pain: Pain level controlled and Adequate analgesia  Post assessment: Post-op Vital signs reviewed, Patient's Cardiovascular Status Stable, Respiratory Function Stable, Patent Airway and Pain level controlled  Last Vitals:  Filed Vitals:   08/09/12 1257  BP: 125/71  Pulse: 83  Temp: 36.5 C  Resp: 18    Post vital signs: Reviewed and stable  Level of consciousness: awake, alert  and oriented  Complications: No apparent anesthesia complications

## 2012-08-09 NOTE — Op Note (Signed)
[id number] 

## 2012-08-09 NOTE — Brief Op Note (Signed)
08/09/2012  11:07 AM  PATIENT:  Carlos Mccall  56 y.o. 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, comminuted intra articular fracture of right distal radius  PROCEDURE:  Procedure(s) (LRB) with comments: CYST REMOVAL (Right) - EXCISIONAL BIOPSY OPEN REDUCTION INTERNAL FIXATION (ORIF) RADIAL FRACTURE (Right)  SURGEON:  Surgeon(s) and Role:    * Wyn Forster., MD - Primary  PHYSICIAN ASSISTANT:   ASSISTANTS:Sire Poet Dasnoit,P.A-C   ANESTHESIA:   none  EBL:  Total I/O In: 1400 [I.V.:1400] Out: -   BLOOD ADMINISTERED:none  DRAINS: none   LOCAL MEDICATIONS USED:  XYLOCAINE   SPECIMEN:  No Specimen  DISPOSITION OF SPECIMEN:  N/A  COUNTS:  YES  TOURNIQUET:  * Missing tourniquet times found for documented tourniquets in log:  40981 *  DICTATION: .Other Dictation: Dictation Number [id number]  PLAN OF CARE: Discharge to home after PACU  PATIENT DISPOSITION:  PACU - hemodynamically stable.

## 2012-08-09 NOTE — Anesthesia Preprocedure Evaluation (Signed)
Anesthesia Evaluation  Patient identified by MRN, date of birth, ID band Patient awake    Reviewed: Allergy & Precautions, H&P , NPO status , Patient's Chart, lab work & pertinent test results  Airway Mallampati: I TM Distance: >3 FB Neck ROM: Full    Dental  (+) Teeth Intact and Dental Advisory Given   Pulmonary  breath sounds clear to auscultation        Cardiovascular Rhythm:Regular Rate:Normal     Neuro/Psych Anxiety Depression    GI/Hepatic GERD-  Medicated and Controlled,  Endo/Other    Renal/GU      Musculoskeletal  (+) Fibromyalgia -  Abdominal   Peds  Hematology   Anesthesia Other Findings   Reproductive/Obstetrics                           Anesthesia Physical Anesthesia Plan  ASA: II  Anesthesia Plan: General   Post-op Pain Management:    Induction: Intravenous  Airway Management Planned: LMA  Additional Equipment:   Intra-op Plan:   Post-operative Plan: Extubation in OR  Informed Consent: I have reviewed the patients History and Physical, chart, labs and discussed the procedure including the risks, benefits and alternatives for the proposed anesthesia with the patient or authorized representative who has indicated his/her understanding and acceptance.   Dental advisory given  Plan Discussed with: CRNA, Anesthesiologist and Surgeon  Anesthesia Plan Comments:         Anesthesia Quick Evaluation

## 2012-08-09 NOTE — Transfer of Care (Signed)
Immediate Anesthesia Transfer of Care Note  Patient: Carlos Mccall  Procedure(s) Performed: Procedure(s) (LRB) with comments: CYST REMOVAL (Right) - EXCISIONAL BIOPSY OPEN REDUCTION INTERNAL FIXATION (ORIF) RADIAL FRACTURE (Right)  Patient Location: PACU  Anesthesia Type:General and Regional  Level of Consciousness: awake, sedated and patient cooperative  Airway & Oxygen Therapy: Patient Spontanous Breathing and Patient connected to face mask oxygen  Post-op Assessment: Report given to PACU RN and Post -op Vital signs reviewed and stable  Post vital signs: Reviewed and stable  Complications: No apparent anesthesia complications

## 2012-08-09 NOTE — Progress Notes (Signed)
Assisted Dr. Crews with right, ultrasound guided, supraclavicular block. Side rails up, monitors on throughout procedure. See vital signs in flow sheet. Tolerated Procedure well. 

## 2012-08-09 NOTE — Anesthesia Procedure Notes (Addendum)
Anesthesia Regional Block:  Supraclavicular block  Pre-Anesthetic Checklist: ,, timeout performed, Correct Patient, Correct Site, Correct Laterality, Correct Procedure, Correct Position, site marked, Risks and benefits discussed,  Surgical consent,  Pre-op evaluation,  At surgeon's request and post-op pain management  Laterality: Right and Upper  Prep: chloraprep       Needles:  Injection technique: Single-shot  Needle Type: Echogenic Needle     Needle Length: 5cm 5 cm Needle Gauge: 21    Additional Needles:  Procedures: ultrasound guided (picture in chart) Supraclavicular block Narrative:  Start time: 08/09/2012 8:56 AM End time: 08/09/2012 9:06 AM Injection made incrementally with aspirations every 5 mL.  Performed by: Personally  Anesthesiologist: Sheldon Silvan  Supraclavicular block Procedure Name: LMA Insertion Date/Time: 08/09/2012 9:45 AM Performed by: Tallie Dodds D Pre-anesthesia Checklist: Patient identified, Emergency Drugs available, Suction available and Patient being monitored Patient Re-evaluated:Patient Re-evaluated prior to inductionOxygen Delivery Method: Circle System Utilized Preoxygenation: Pre-oxygenation with 100% oxygen Intubation Type: IV induction Ventilation: Mask ventilation without difficulty LMA: LMA inserted LMA Size: 4.0 Number of attempts: 1 Airway Equipment and Method: bite block Placement Confirmation: positive ETCO2 Tube secured with: Tape Dental Injury: Teeth and Oropharynx as per pre-operative assessment

## 2012-08-10 ENCOUNTER — Encounter (HOSPITAL_BASED_OUTPATIENT_CLINIC_OR_DEPARTMENT_OTHER): Payer: Self-pay | Admitting: Orthopedic Surgery

## 2012-08-10 NOTE — Addendum Note (Signed)
Addendum  created 08/10/12 0659 by Lance Coon, CRNA   Modules edited:Anesthesia Responsible Staff

## 2012-08-10 NOTE — Addendum Note (Signed)
Addendum  created 08/10/12 0659 by [place] Adaja Wander, CRNA   Modules edited:Anesthesia Responsible Staff    

## 2012-08-13 NOTE — Op Note (Signed)
[name], Mccall NO.:  [id number]  MEDICAL RECORD NO.:  [id number]  LOCATION:                                 FACILITY:  PHYSICIAN:  Carlos Mccall. Carlos Mccall, M.D. DATE OF BIRTH:  Dec 30, 1955  DATE OF PROCEDURE:  08/09/2012 DATE OF DISCHARGE:                              OPERATIVE REPORT   PREOPERATIVE DIAGNOSES: 1. Comminuted intra-articular significantly displaced fracture, right     distal radius. 2. Enlarging cyst/mass, dorsal ulnar aspect of right forearm overlying     extensor carpi ulnaris tendon sheath.  POSTOPERATIVE DIAGNOSES: 1. Comminuted intra-articular fracture of right distal radius with     extreme comminution of radial styloid fragment and marked     displacement and ulnar styloid fracture. 2. Myxoid cyst associated with vascular structure probably vein     adjacent to the extensor carpi ulnaris right distal dorsal ulnar     forearm.  OPERATION: 1. Open reduction and internal fixation of right radius employing a 7     PEG DVR plate system. 2. Through separate incision, resection of a myxoid cyst from the     extensor carpi ulnaris tendon sheath and vascular structure most     likely vein.  OPERATING SURGEON:  Carlos Mccall. Carlos Mccall, M.D.  ASSISTANT:  Carlos Reeks. Dasnoit, PA-C.  ANESTHESIA:  General by LMA supplemented by ropivacaine interscalene block placed preoperatively by Dr. Ivin Mccall in the holding area.  INDICATIONS:  Carlos Mccall is a 56 year old gentleman well acquainted with our practice.  We have cared for numerous orthopedic predicaments of the upper extremities including shoulder pain, partial-thickness rotator cuff tear, and arthritis.  Several weeks ago, Carlos Mccall presented with an enlarging mass in the dorsal ulnar aspect of his right distal forearm.  This transilluminated, appeared to be a myxoid cyst.  He requested removal as it was causing him discomfort.  We made arrangements for surgery at this time.  Over the past  weekend, Carlos Mccall sustained a very significant injury at home where he fell down stairs.  He sustained a very significant partial-thickness skin loss to his knee region on the right.  He had contusions about the head, but did not have loss of consciousness,  He had an obvious fracture of his right distal radius.  He was seen at [facility] Emergency Room where his wounds were clean and dressed.  X-rays of the wrist demonstrated comminuted articular fracture of the right distal radius.  He saw [place] for an acute consult early this week and was placed on the operative schedule at this time for open reduction and internal fixation of his distal radius fracture.  After informed consent, he is brought to the operating room at this time.  Preoperatively, he was reminded potential risks and benefits of surgery. He understands that there is always a small risk of infection.  There are anesthetic risks that will be detailed by Dr. Ivin Mccall.  We cannot guarantee he will not develop another myxoid cyst as these are degenerative in nature and poorly understood.  We should be able to achieve satisfactory reduction of his radius.  He may have to have the plate removed in the future.  It  is possible due to comminution that he could still develop arthritis in his joints over years after a level reduction.  Questions were invited and answered in detail with Carlos Mccall and his wife present.  PROCEDURE:  Carlos Mccall was brought to room 2 of the [facility] and placed in a supine position on the operating table.  Following induction of general anesthesia by LMA technique, the right hand and arm were prepped with Betadine soap and solution, sterilely draped.  A pneumatic tourniquet was applied to the proximal right brachium.  Following administration of 1 g of vancomycin as an IV prophylactic antibiotic beginning in the holding area over 90 minutes, the surgical time-out was completed.   Carlos Mccall is noted to have multiple drug allergies, which were detailed and his intolerance to latex was noted. The entire case was completed in a latex-free manner.  Procedure commenced with a standard volar DVR incision overlying the flexor carpi radialis tendon.  Subcutaneous tissues were carefully divided taking care to identify and spare the lateral antebrachial cutaneous nerve branches.  The fascia overlying the flexor carpi radialis was released followed by release of the deep fascia.  The pollicis longus was retracted in ulnar direction and the radial artery in a radial direction.  The pronator quadratus was elevated and the fracture site inspected.  There was moderate comminution.  Muscle and comminuted bone fragments were removed from the fracture site.  After irrigation and clot removal, the fracture was reduced anatomically.  There was severe comminution of the radial styloid with a split into the articular surface.  A dental pick was used to remove free fragments of bone and clot followed by micro curette.  We then anatomically reduced the articular surface.  A 7 PEG DVR plate system was placed with combination of smooth pegs and threaded screws to retain the styloid fragments in an anatomic position. We ultimately deployed all 7 pegs and screws controlling position with fluoroscopic images.  Excellent purchase was achieved in the bone and pegs were confirmed to be below the articular surface on multiple images.  A virtually anatomic reduction of the distal radius was achieved.  The wound was thoroughly irrigated followed by repair of the pronator quadratus over the plate with interrupted suture of 0-Vicryl.  The skin was repaired with subcutaneous 3-0 Vicryl and intradermal 3-0 Prolene with Steri-Strips.  The attention of the operative team was then directed to the dorsal distal forearm.  The mass was challenging to palpate due to the extreme swelling following the  recent fracture. Having carefully examined Carlos Mccall in the office, I knew it was located just proximal to the ulnar head.  By palpation, we found the mass and then subsequently performed a 3-cm longitudinal incision.  The mass was adherent to the extensor carpi ulnaris, and retinaculum.  It appeared to be originating on the large caliber vascular structure likely a vein.  This was circumferentially dissected and the vein and the mass was resected with its mucinous contents.  3-0 Vicryl was used to ligate the vein.  We also performed bipolar cautery of several small feeding vessels.  We carefully inspected for a satellite cyst and did not identify any.  The wound was then repaired with subcutaneous 3-0 Vicryl and intradermal 3-0 Prolene.  For aftercare, Carlos Mccall is provided prescriptions for Dilaudid 2 mg 1 or 2 tablets p.o. q.4-6 hours p.r.n. pain, 30 tablets without refill.  He has been provided vancomycin to cover a perioperative intervention. Due to  his multiple drug allergies, we will follow his knee abrasion carefully and we will cover him with a broad-spectrum antibiotic to be certain that we do not develop a postoperative metastatic infection.     Carlos Mccall, M.D.     RVS/MEDQ  D:  08/09/2012  T:  08/10/2012  Job:  [id number]

## 2012-08-14 ENCOUNTER — Encounter (HOSPITAL_BASED_OUTPATIENT_CLINIC_OR_DEPARTMENT_OTHER): Payer: Self-pay

## 2012-11-18 ENCOUNTER — Ambulatory Visit: Payer: Self-pay

## 2012-11-18 ENCOUNTER — Telehealth (HOSPITAL_COMMUNITY): Payer: Self-pay | Admitting: Family Medicine

## 2012-11-18 ENCOUNTER — Emergency Department (INDEPENDENT_AMBULATORY_CARE_PROVIDER_SITE_OTHER): Payer: Medicare Other

## 2012-11-18 ENCOUNTER — Ambulatory Visit (INDEPENDENT_AMBULATORY_CARE_PROVIDER_SITE_OTHER): Payer: Medicare Other | Admitting: Family Medicine

## 2012-11-18 ENCOUNTER — Emergency Department (HOSPITAL_COMMUNITY)
Admission: EM | Admit: 2012-11-18 | Discharge: 2012-11-18 | Disposition: A | Discharge status: Home / Self Care | Payer: Medicare Other | Source: Home / Self Care

## 2012-11-18 ENCOUNTER — Encounter (HOSPITAL_COMMUNITY): Payer: Self-pay | Admitting: Emergency Medicine

## 2012-11-18 VITALS — BP 131/82 | HR 96 | Temp 98.0°F | Resp 18 | Wt 181.0 lb

## 2012-11-18 DIAGNOSIS — R05 Cough: Secondary | ICD-10-CM

## 2012-11-18 DIAGNOSIS — J069 Acute upper respiratory infection, unspecified: Secondary | ICD-10-CM

## 2012-11-18 DIAGNOSIS — J0100 Acute maxillary sinusitis, unspecified: Secondary | ICD-10-CM

## 2012-11-18 LAB — POCT CBC
Granulocyte percent: 62.6 %G (ref 37–80)
HCT, POC: 41.8 % — AB (ref 43.5–53.7)
Hemoglobin: 13.9 g/dL — AB (ref 14.1–18.1)
Lymph, poc: 1.6 (ref 0.6–3.4)
MCHC: 33.3 g/dL (ref 31.8–35.4)
MPV: 10.4 fL (ref 0–99.8)
POC Granulocyte: 3.8 (ref 2–6.9)
POC MID %: 10.9 %M (ref 0–12)
RBC: 4.48 M/uL — AB (ref 4.69–6.13)

## 2012-11-18 LAB — POCT INFLUENZA A/B
Influenza A, POC: NEGATIVE
Influenza B, POC: NEGATIVE

## 2012-11-18 MED ORDER — FLUTICASONE PROPIONATE 50 MCG/ACT NA SUSP: 2.0000 | Freq: Every day | NASAL | Status: DC

## 2012-11-18 MED ORDER — AZITHROMYCIN 250 MG PO TABS: ORAL_TABLET | ORAL | Status: DC

## 2012-11-18 MED ORDER — BENZONATATE 200 MG PO CAPS: 200.0000 mg | ORAL_CAPSULE | Freq: Three times a day (TID) | ORAL | Status: DC | PRN

## 2012-11-18 NOTE — ED Notes (Signed)
Patient called stating that he could not take Tessalon.  He states is causes anaphylaxis.  Patient also concerned about taking OTC meds due to them causing headaches.  Dr Denyse Amass spoke with patient for several minutes.  Patient requesting Zpak.  Patient made aware that he did not need an antibiotic.

## 2012-11-18 NOTE — ED Provider Notes (Signed)
Medical screening examination/treatment/procedure(s) were performed by a resident physician and as supervising physician I was immediately available for consultation/collaboration.  Leslee Home, M.D.  Reuben Likes, MD 11/18/12 [phone]

## 2012-11-18 NOTE — Patient Instructions (Addendum)
Cough  Acute upper respiratory infections of unspecified site - Plan: POCT CBC, POCT Influenza A/B  Acute maxillary sinusitis - Plan: fluticasone (FLONASE) 50 MCG/ACT nasal spray, azithromycin (ZITHROMAX Z-PAK) 250 MG tablet

## 2012-11-18 NOTE — Progress Notes (Signed)
[address]   [place], [place]  [address]   [phone]  Subjective:    Patient ID: Carlos Mccall, male    DOB: [birth year], 57 y.o.   MRN: [id number]  HPI This 57 y.o. male presents for evaluation of cough.  Onset three weeks ago.  Wife sick.  "I am a virtual hermit.  I don't go anywhere; I'm disabled".  Has progressively worsened.  Onset of cough three days ago.  Clear mucous to now mucous with blood and thick.  Rib pain B secondary to coughing.  +nasal congestion.  +constant rhinorrhea.  Went to [facility] this morning; s/p CXR; diagnosed with post-viral cough; prescribed Tessalon Perles; s/p CXR this morning negative.  History of pneumonia 6-7 years ago.  S/p extensive allergy testing last year due to cough for 2-3 months in 2013.  +feverish initially but then resolved; recurrent fever for past 3 days; +chills.  Low grade fever.  Congestion mostly in head for past three weeks; now has cough for past three days.  Has been using Zinc, Listerine.  Unable to use antihistamine, Mucinex, codeine.   S/p flu vaccine 06/2012.  +ST for past several days; +B ear pain L>R; ears leaking a lot.  Ringing in ears has been worse; ears are popping a lot.  Lots of pressure in ears.  No body aches in past three days.   Review of Systems  Constitutional: Positive for fever, chills and fatigue. Negative for diaphoresis.  HENT: Positive for congestion, sore throat, rhinorrhea, sneezing, voice change and postnasal drip. Negative for ear pain and trouble swallowing.   Respiratory: Positive for cough. Negative for shortness of breath, wheezing and stridor.   Gastrointestinal: Negative for nausea, vomiting and diarrhea.  Skin: Negative for rash.  Neurological: Positive for headaches. Negative for dizziness and light-headedness.        Past Medical History  Diagnosis Date  . Head trauma     history of multiple  . Migraines   . Depression   . Anxiety   . GERD (gastroesophageal reflux disease)   . Allergy    . Hyperlipidemia   . Fibromyalgia   . Post traumatic stress disorder (PTSD)   . Migraines   . Spinal stenosis   . IBS (irritable bowel syndrome)   . Anxiety   . Tremors of nervous system     Past Surgical History  Procedure Laterality Date  . Lumbar laminectomy    . Trigger thumb release    . Tonsillectomy    . Closed reduction shoulder dislocation    . Thumb arthroscopy      both  . Ear cyst excision  08/09/2012    Procedure: CYST REMOVAL;  Surgeon: Wyn Forster., MD;  Location: [facility];  Service: Orthopedics;  Laterality: Right;  EXCISIONAL BIOPSY  . Orif radial fracture  08/09/2012    Procedure: OPEN REDUCTION INTERNAL FIXATION (ORIF) RADIAL FRACTURE;  Surgeon: Wyn Forster., MD;  Location:  SURGERY CENTER;  Service: Orthopedics;  Laterality: Right;    Prior to Admission medications   Medication Sig Start Date End Date Taking? Authorizing Provider  aspirin 81 MG tablet Take 81 mg by mouth daily.     Yes Historical Provider, MD  b complex vitamins tablet Take 1 tablet by mouth daily.   Yes Historical Provider, MD  beta carotene 46962 UNIT capsule Take 25,000 Units by mouth daily.   Yes Historical Provider, MD  beta carotene w/minerals (OCUVITE) tablet  Take 1 tablet by mouth daily.   Yes Historical Provider, MD  cholecalciferol (VITAMIN D) 1000 UNITS tablet Take 2,000 Units by mouth daily.   Yes Historical Provider, MD  clonazePAM (KLONOPIN) 2 MG tablet Take 2-3 mg by mouth 2 (two) times daily. 1 mg in the morning and 3 mg in the evening   Yes Historical Provider, MD  Coenzyme Q10 (CO Q 10 PO) Take 200 mg by mouth daily.   Yes Historical Provider, MD  Cyanocobalamin (VITAMIN B 12 PO) Take 100 mg by mouth 4 (four) times daily.   Yes Historical Provider, MD  eletriptan (RELPAX) 40 MG tablet One tablet by mouth at onset of headache. May repeat in 2 hours if headache persists or recurs. may repeat in 2 hours if necessary for migraine   Yes  Historical Provider, MD  EPINEPHrine (EPIPEN JR) 0.15 MG/0.3ML injection Inject 0.15 mg into the muscle as needed. For bee stings   Yes Historical Provider, MD  fenofibrate (TRICOR) 145 MG tablet Take 145 mg by mouth daily.   Yes Historical Provider, MD  metoCLOPramide (REGLAN) 10 MG tablet Take 10 mg by mouth 4 (four) times daily.     Yes Historical Provider, MD  saw palmetto 160 MG capsule Take 160 mg by mouth daily.   Yes Historical Provider, MD  Sodium Fluoride (PREVIDENT 5000 PLUS DT) Place 1 application onto teeth once a week.   Yes Historical Provider, MD  venlafaxine (EFFEXOR-XR) 150 MG 24 hr capsule Take 150 mg by mouth daily.     Yes Historical Provider, MD    Allergies  Allergen Reactions  . Codeine     REACTION: RASH/JITTERS  Hallucinations  . Latex Other (See Comments)    Only if patient wears it himself, causes rash  . Shellfish Allergy     migraine  . Tetracyclines & Related Nausea And Vomiting    headache    History   Social History  . Marital Status: Married    Spouse Name: N/A    Number of Children: N/A  . Years of Education: N/A   Occupational History  . Not on file.   Social History Main Topics  . Smoking status: Never Smoker   . Smokeless tobacco: Never Used  . Alcohol Use: No  . Drug Use: No  . Sexually Active: Not on file   Other Topics Concern  . Not on file   Social History Narrative  . No narrative on file    Family History  Problem Relation Age of Onset  . Adopted: Yes    Objective:   Physical Exam  Nursing note and vitals reviewed. Constitutional: He is oriented to person, place, and time. He appears well-developed and well-nourished. No distress.  HENT:  Head: Normocephalic and atraumatic.  Right Ear: External ear normal.  Left Ear: External ear normal.  Nose: Mucosal edema and rhinorrhea present. Right sinus exhibits maxillary sinus tenderness. Right sinus exhibits no frontal sinus tenderness. Left sinus exhibits maxillary sinus  tenderness. Left sinus exhibits no frontal sinus tenderness.  Mouth/Throat: Oropharynx is clear and moist.  Eyes: Conjunctivae are normal. Pupils are equal, round, and reactive to light.  Neck: Normal range of motion. Neck supple.  Cardiovascular: Normal rate, regular rhythm and normal heart sounds.  Exam reveals no gallop and no friction rub.   No murmur heard. Pulmonary/Chest: Effort normal and breath sounds normal. He has no wheezes. He has no rales.  Lymphadenopathy:    He has cervical adenopathy.  Neurological: He  is alert and oriented to person, place, and time.  Skin: Skin is warm and dry. No rash noted. He is not diaphoretic.  Psychiatric: He has a normal mood and affect. His behavior is normal.       Results for orders placed in visit on 11/18/12  POCT CBC      Result Value Range   WBC 6.0  4.6 - 10.2 K/uL   Lymph, poc 1.6  0.6 - 3.4   POC LYMPH PERCENT 26.5  10 - 50 %L   MID (cbc) 0.7  0 - 0.9   POC MID % 10.9  0 - 12 %M   POC Granulocyte 3.8  2 - 6.9   Granulocyte percent 62.6  37 - 80 %G   RBC 4.48 (*) 4.69 - 6.13 M/uL   Hemoglobin 13.9 (*) 14.1 - 18.1 g/dL   HCT, POC 21.3 (*) 08.6 - 53.7 %   MCV 93.3  80 - 97 fL   MCH, POC 31.0  27 - 31.2 pg   MCHC 33.3  31.8 - 35.4 g/dL   RDW, POC 57.8     Platelet Count, POC 189  142 - 424 K/uL   MPV 10.4  0 - 99.8 fL    Assessment & Plan:  Cough  Acute upper respiratory infections of unspecified site - Plan: POCT CBC, POCT Influenza A/B  Acute maxillary sinusitis   1. Cough:  New.  Supportive care with rest, fluids.  S/p CXR earlier today at [facility] negative.  Continue Tessalon Perles prescribed earlier today. 2.  Acute Sinusitis:  New.  Rx for Zpack, Flonase.   3.  URI: New.  Rapid flu testing negative; CBC reassuring; discussed results in detail with pt during visit.  Meds ordered this encounter  Medications  . DISCONTD: fluticasone (FLONASE) 50 MCG/ACT nasal spray    Sig: Place 2 sprays into the nose  daily.    Dispense:  16 g    Refill:  6  . DISCONTD: azithromycin (ZITHROMAX Z-PAK) 250 MG tablet    Sig: Two tablets daily x 1 day, then one tablet daily x 4 days    Dispense:  6 each    Refill:  0  . fluticasone (FLONASE) 50 MCG/ACT nasal spray    Sig: Place 2 sprays into the nose daily.    Dispense:  16 g    Refill:  6  . azithromycin (ZITHROMAX Z-PAK) 250 MG tablet    Sig: Two tablets daily x 1 day, then one tablet daily x 4 days    Dispense:  6 each    Refill:  0

## 2012-11-18 NOTE — ED Provider Notes (Signed)
KATHRYN LINAREZ is a 57 y.o. male who presents to Urgent Care today for cough, and wheezing. Patient notes a nonproductive cough for last 2-1/2 weeks which worsened this morning. He notes one episode of wheezing when he woke up  this morning. He denies any significant fever or shortness of breath or chest pain. He feels well otherwise. He has tried Aleve and ibuprofen as well as some over-the-counter herbal supplements which have not helped much.     PMH reviewed. Significant for traumatic brain injury and PTSD. Additionally significant for malaria 8 years ago.  History  Substance Use Topics  . Smoking status: Never Smoker   . Smokeless tobacco: Never Used  . Alcohol Use: No   ROS as above Medications reviewed. No current facility-administered medications for this encounter.   Current Outpatient Prescriptions  Medication Sig Dispense Refill  . ALPHA LIPOIC ACID PO Take 600 mg by mouth daily.      Marland Kitchen aspirin 81 MG tablet Take 81 mg by mouth daily.        Marland Kitchen b complex vitamins tablet Take 1 tablet by mouth daily.      . B Complex-C (B-COMPLEX WITH VITAMIN C) tablet Take 1 tablet by mouth daily.      . benzonatate (TESSALON) 200 MG capsule Take 1 capsule (200 mg total) by mouth 3 (three) times daily as needed for cough.  20 capsule  0  . beta carotene [address]109 UNIT capsule Take 25,000 Units by mouth daily.      . beta carotene w/minerals (OCUVITE) tablet Take 1 tablet by mouth daily.      . Bilberry 100 MG CAPS Take 200 mg by mouth 2 (two) times daily.      . cholecalciferol (VITAMIN D) 1000 UNITS tablet Take 2,000 Units by mouth daily.      . Chromium 500 MCG TABS Take 500 mcg by mouth every other day.      . clonazePAM (KLONOPIN) 2 MG tablet Take 2-3 mg by mouth 2 (two) times daily. 1 mg in the morning and 3 mg in the evening      . Coenzyme Q10 (CO Q 10 PO) Take 200 mg by mouth daily.      . Cyanocobalamin (VITAMIN B 12 PO) Take 100 mg by mouth 4 (four) times daily.      Marland Kitchen eletriptan (RELPAX)  40 MG tablet One tablet by mouth at onset of headache. May repeat in 2 hours if headache persists or recurs. may repeat in 2 hours if necessary for migraine      . EPINEPHrine (EPIPEN JR) 0.15 MG/0.3ML injection Inject 0.15 mg into the muscle as needed. For bee stings      . fenofibrate (TRICOR) 145 MG tablet Take 145 mg by mouth daily.      Marland Kitchen FEVERFEW PO Take 38 mg by mouth daily.      Donald Prose (FLORA-Q) CAPS Take 1 capsule by mouth 2 (two) times daily.      . Garlic 300 MG CAPS Take 600 mg by mouth daily.      Marland Kitchen HYDROmorphone (DILAUDID) 2 MG tablet Take 1 tablet (2 mg total) by mouth every 4 (four) hours as needed for pain.  20 tablet  0  . HYDROmorphone (DILAUDID) 2 MG tablet 1 or 2 tabs every 4 hours as needed for pain  30 tablet  0  . metoCLOPramide (REGLAN) 10 MG tablet Take 10 mg by mouth 4 (four) times daily.        Marland Kitchen  MILK THISTLE EXTRACT PO Take 600 mg by mouth once.      . Nutritional Supplements (PYCNOGENOL) 30 MG CAPS Take 60 mg by mouth every other day.      . omega-3 acid ethyl esters (LOVAZA) 1 G capsule Take 2 g by mouth daily.      Marland Kitchen OVER THE COUNTER MEDICATION Take 1 tablet by mouth daily. Solgar VM 75      . OVER THE COUNTER MEDICATION Take 1 tablet by mouth daily. ACES + Zinc      . OVER THE COUNTER MEDICATION Take 500 mg by mouth daily.      . peppermint oil liquid Take 0.2 mLs by mouth daily.      . Potassium 99 MG TABS Take 99 mg by mouth daily.      . saw palmetto 160 MG capsule Take 160 mg by mouth daily.      . sertraline (ZOLOFT) 25 MG tablet Take 25 mg by mouth daily.        . Sodium Fluoride (PREVIDENT 5000 PLUS DT) Place 1 application onto teeth once a week.      . sulfamethoxazole-trimethoprim (SEPTRA DS) 800-160 MG per tablet Take 1 tablet by mouth 2 (two) times daily.  10 tablet  0  . venlafaxine (EFFEXOR-XR) 150 MG 24 hr capsule Take 150 mg by mouth daily.          Exam:  BP 113/94  Pulse 110  Temp(Src) 98.4 F (36.9 C) (Oral)  Resp 20  SpO2 99% Gen:  Well NAD HEENT: EOMI,  MMM Lungs: CTABL Nl WOB Heart: RRR no MRG Abd: NABS, NT, ND Exts: Non edematous BL  LE, warm and well perfused.   No results found for this or any previous visit (from the past 24 hour(s)). Dg Chest 2 View  11/18/2012  *RADIOLOGY REPORT*  Clinical Data: Cough for 2 weeks.  Congestion.  CHEST - 2 VIEW  Comparison: 07/03/2007.  Findings:  Cardiopericardial silhouette within normal limits. Mediastinal contours normal. Trachea midline.  No airspace disease or effusion.  IMPRESSION: No active cardiopulmonary disease.   Original Report Authenticated By: Andreas Newport, M.D.     Assessment and Plan: 57 y.o. male with viral URI with cough.  Discussed options. Plan for [employer]. Recommend followup with primary care provider if not improved in the next several days.  Discussed warning signs or symptoms. Please see discharge instructions. Patient expresses understanding.      Rodolph Bong, MD 11/18/12 1236

## 2012-11-18 NOTE — ED Notes (Addendum)
Pt c/o nonproductive cough, ringing in left ear, chest congestion and low grade temp x 2 1/2 weeks. Pt has tried aleve,zinc saline and ibuprofen with no relief.

## 2012-12-05 ENCOUNTER — Telehealth: Payer: Self-pay

## 2012-12-05 NOTE — Telephone Encounter (Signed)
Left orbital facial pain/ cough nasal drainage he took z pack with little relief. Advised to try Mucinex. Wife states he does not tolerate this. He uses [facility].

## 2012-12-05 NOTE — Telephone Encounter (Signed)
Pt was more than two weeks ago, needs to RTC if not improving

## 2012-12-05 NOTE — Telephone Encounter (Signed)
Left message for him to call me back.  

## 2012-12-05 NOTE — Telephone Encounter (Signed)
Patient is still having symptoms and would like to know if he can just get another prescription or if he would need to come in.

## 2012-12-06 NOTE — Telephone Encounter (Signed)
Called his wife to advise

## 2013-03-25 ENCOUNTER — Other Ambulatory Visit: Payer: Self-pay | Admitting: Neurosurgery

## 2013-03-25 DIAGNOSIS — M545 Low back pain: Secondary | ICD-10-CM

## 2013-03-27 ENCOUNTER — Ambulatory Visit
Admission: RE | Admit: 2013-03-27 | Discharge: 2013-03-27 | Disposition: A | Discharge status: Home / Self Care | Payer: Medicare Other | Source: Ambulatory Visit | Attending: Neurosurgery | Admitting: Neurosurgery

## 2013-03-27 DIAGNOSIS — M545 Low back pain: Secondary | ICD-10-CM

## 2013-03-28 ENCOUNTER — Inpatient Hospital Stay: Admission: RE | Admit: 2013-03-28 | Payer: Medicare Other | Source: Ambulatory Visit

## 2013-03-28 ENCOUNTER — Other Ambulatory Visit: Payer: Medicare Other

## 2013-04-23 ENCOUNTER — Encounter (HOSPITAL_COMMUNITY): Payer: Self-pay | Admitting: Pharmacist

## 2013-04-23 ENCOUNTER — Other Ambulatory Visit: Payer: Self-pay | Admitting: Neurosurgery

## 2013-05-02 NOTE — Pre-Procedure Instructions (Signed)
Carlos Mccall  05/02/2013   Your procedure is scheduled on:  05/07/2013  Report to [facility] at 5:30 AM.  Call this number if you have problems the morning of surgery: [phone]   Remember:   Do not eat food or drink liquids after midnight. On MONDAY   Take these medicines the morning of surgery with A SIP OF WATER: celexa, klonopin, effexor   Do not wear jewelry  Do not wear lotions, powders, or perfumes. You may wear deodorant.  Do not shave 48 hours prior to surgery. Men may shave face and neck.  Do not bring valuables to the hospital.  [facility] is not responsible                   for any belongings or valuables.  Contacts, dentures or bridgework may not be worn into surgery.  Leave suitcase in the car. After surgery it may be brought to your room.  For patients admitted to the hospital, checkout time is 11:00 AM the day of  discharge.   Patients discharged the day of surgery will not be allowed to drive  home.  Name and phone number of your driver:   Special Instructions: Shower using CHG 2 nights before surgery and the night before surgery.  If you shower the day of surgery use CHG.  Use special wash - you have one bottle of CHG for all showers.  You should use approximately 1/3 of the bottle for each shower.   Please read over the following fact sheets that you were given: Pain Booklet, Coughing and Deep Breathing, MRSA Information and Surgical Site Infection Prevention

## 2013-05-03 ENCOUNTER — Encounter (HOSPITAL_COMMUNITY): Payer: Self-pay

## 2013-05-03 ENCOUNTER — Encounter (HOSPITAL_COMMUNITY)
Admission: RE | Admit: 2013-05-03 | Discharge: 2013-05-03 | Disposition: A | Discharge status: Home / Self Care | Payer: Medicare Other | Source: Ambulatory Visit | Attending: Neurosurgery | Admitting: Neurosurgery

## 2013-05-03 DIAGNOSIS — Z01812 Encounter for preprocedural laboratory examination: Secondary | ICD-10-CM | POA: Insufficient documentation

## 2013-05-03 HISTORY — DX: Personal history of other medical treatment: Z92.89

## 2013-05-03 HISTORY — DX: Shortness of breath: R06.02

## 2013-05-03 HISTORY — DX: Other complications of anesthesia, initial encounter: T88.59XA

## 2013-05-03 HISTORY — DX: Unspecified osteoarthritis, unspecified site: M19.90

## 2013-05-03 HISTORY — DX: Adverse effect of unspecified anesthetic, initial encounter: T41.45XA

## 2013-05-03 HISTORY — DX: Orthostatic hypotension: I95.1

## 2013-05-03 LAB — BASIC METABOLIC PANEL
BUN: 15 mg/dL (ref 6–23)
CO2: 27 mEq/L (ref 19–32)
Calcium: 10.2 mg/dL (ref 8.4–10.5)
GFR calc non Af Amer: 58 mL/min — ABNORMAL LOW (ref >90)
Glucose, Bld: 93 mg/dL (ref 70–99)
Sodium: 136 mEq/L (ref 135–145)

## 2013-05-03 LAB — CBC
Hemoglobin: 15.3 g/dL (ref 13.0–17.0)
MCH: 32.5 pg (ref 26.0–34.0)
MCHC: 36.8 g/dL — ABNORMAL HIGH (ref 30.0–36.0)
MCV: 88.3 fL (ref 78.0–100.0)
Platelets: 169 10*3/uL (ref 150–400)
RBC: 4.71 MIL/uL (ref 4.22–5.81)

## 2013-05-20 ENCOUNTER — Telehealth: Payer: Self-pay | Admitting: Neurology

## 2013-05-20 NOTE — Telephone Encounter (Signed)
I called the patient to let her know that I did see her message. I printed it and will give it to Alverda Skeans, RN who will reassign her and schedule her husband's next appointment. She will call when this takes place.

## 2013-05-20 NOTE — Telephone Encounter (Signed)
Message copied by [facility] on Mon May 20, 2013  2:57 PM ------      Message from: Warren Lacy A      Created: Mon May 20, 2013  9:38 AM      Contact: Marjory Lies       She has called earlier about getting Carlos Mccall assigned a new doctor, then she called back and said that one of her students parent is a doctor here(Dr. Terrace Arabia) and she prefer not to see her because in case something goes wrong she doesn't want to mess the relationship up between her and the student ------

## 2013-05-21 ENCOUNTER — Ambulatory Visit (HOSPITAL_COMMUNITY): Admission: RE | Admit: 2013-05-21 | Payer: Medicare Other | Source: Ambulatory Visit | Admitting: Neurosurgery

## 2013-05-21 ENCOUNTER — Encounter (HOSPITAL_COMMUNITY): Admission: RE | Payer: Self-pay | Source: Ambulatory Visit

## 2013-05-21 SURGERY — LUMBAR LAMINECTOMY/DECOMPRESSION MICRODISCECTOMY 1 LEVEL
Anesthesia: General

## 2013-05-22 NOTE — Telephone Encounter (Signed)
I assigned to Dr. Marjory Lies.  Pixie Casino given to schedule.

## 2013-05-24 ENCOUNTER — Telehealth: Payer: Self-pay | Admitting: Diagnostic Neuroimaging

## 2013-05-24 ENCOUNTER — Other Ambulatory Visit: Payer: Self-pay

## 2013-05-24 MED ORDER — ELETRIPTAN HYDROBROMIDE 40 MG PO TABS: 40.0000 mg | ORAL_TABLET | ORAL | Status: DC | PRN

## 2013-05-27 ENCOUNTER — Telehealth: Payer: Self-pay | Admitting: Diagnostic Neuroimaging

## 2013-05-29 NOTE — Telephone Encounter (Signed)
I called pt and relayed that would need to be seen prior to changing medication.  Appt is in 07/2013 and is on waitlist.   Can give samples relpax which what he is on now.   Medicare states relpax not formulary, try imitrex.  He verbalized understanding.

## 2013-05-31 ENCOUNTER — Telehealth: Payer: Self-pay | Admitting: Diagnostic Neuroimaging

## 2013-05-31 NOTE — Telephone Encounter (Signed)
Patient is concerned that he is having back surgery and he had migraines when he had his first back surgery. Patient's migraines have been worse with recent medication that has been changed today. None of the drugs seem to stop it. Patient is concerned about what kind of trouble he can get in if he has back surgery. What will happen if he is having surgery and is having a migraine at the same time. Patient has complex migraines since June 1996 back surgery. The consist of hallucinations, pain, auras, nausea, vomiting, vasal, aphasia, ataxia, can not see through aura then can't clears, etc.   Patient is trying to be scheduled for surgery but, he has had migraines x 4 this week so they can not get it scheduled. He really has some significant concerns about his migraines and management. We have rescheduled his appointment with Dr. Marjory Lies from October to September. Patient was Dr. Imagene Gurney patient.  Trying to schedule surgery. Really needed earlier appointment - Transfer from Dr. Sandria Manly

## 2013-06-18 ENCOUNTER — Telehealth: Payer: Self-pay | Admitting: Diagnostic Neuroimaging

## 2013-06-18 NOTE — Telephone Encounter (Signed)
Returned call. No answer.  

## 2013-06-19 ENCOUNTER — Ambulatory Visit (INDEPENDENT_AMBULATORY_CARE_PROVIDER_SITE_OTHER): Payer: Medicare Other | Admitting: Diagnostic Neuroimaging

## 2013-06-19 ENCOUNTER — Telehealth: Payer: Self-pay | Admitting: Diagnostic Neuroimaging

## 2013-06-19 ENCOUNTER — Encounter: Payer: Self-pay | Admitting: Diagnostic Neuroimaging

## 2013-06-19 VITALS — BP 122/82 | HR 54 | Temp 97.7°F | Ht 67.5 in | Wt 173.0 lb

## 2013-06-19 DIAGNOSIS — G43109 Migraine with aura, not intractable, without status migrainosus: Secondary | ICD-10-CM | POA: Insufficient documentation

## 2013-06-19 MED ORDER — DICLOFENAC POTASSIUM(MIGRAINE) 50 MG PO PACK: 50.0000 mg | PACK | ORAL | Status: DC | PRN

## 2013-06-19 MED ORDER — SUMATRIPTAN SUCCINATE 100 MG PO TABS: 100.0000 mg | ORAL_TABLET | Freq: Once | ORAL | Status: DC | PRN

## 2013-06-19 NOTE — Telephone Encounter (Signed)
Patient called front desk and said Ins will not pay for Cambia, but they will pay for Diclofenac tabs.  Would you like to change Rx?  Please advise.  Thank you.

## 2013-06-19 NOTE — Progress Notes (Signed)
[facility]  PATIENT: Carlos Mccall DOB: [birth year]  REFERRING CLINICIAN:  HISTORY FROM: patient and wife REASON FOR VISIT: follow up (transfer, Dr. Sandria Manly)   HISTORICAL  CHIEF COMPLAINT:  Chief Complaint  Patient presents with  . Follow-up    Dr. Sandria Manly pt, fx to R ulnar, migraine HA associated w/ aphasia and facial numbness    HISTORY OF PRESENT ILLNESS:   Update 06/19/13: Since last visit, migraines increasing. Now up to 2-10 per month. Best year was 2008 (4 in 1 year). On effexor, zoloft, clonazepam for migraine prevention, and reglan + relpax for treatment. Tried cambia samples in past, which helped (not rx'd?). Concerned about upcoming lumbar spine surgery, and potential impact on migraines.  PRIOR HPI (09/17/12, Dr. Sandria Manly): 57 year old right handed white married male with a history of recurring complex migraine headaches associated with Aphasia and facial numbness, occurring about 3-4 times per month. The headache  last as long as 3 days to 4 days with eye pain and the side effects can last up to 2 weeks.Marland Kitchen He went two years without headaches and  had  a significant increase beginnig in the spring of 2012 until August, occurring once per week to once every 2 weeks. He believes  the change was related to going outside of his house. After he stayed  inside, the headaches resolved. He now averages 2-12 headaches per year He has fibromyalgia, peptic ulcer disease, chronic fatigue, status post lumbar laminectomy at L5-S1 6/96, repair of separated tendons in his left thumb in 2009, right hand surgery in 1983, tonsillectomy 06/18/68, and left shoulder reconstruction 03/18/2010. He has  a history of stinging pain in his left shoulder that extends to the biceps and leg numbness down the forearm and left hand. He has been through 3 physical therapy exercise programs. He has a  history of previous left shoulder injury in the 1980s while working in [place]. He was treated with  dilaudid for several days and subsequently Vicodin. He noticed anxiety on Vicodin  and was seen in the emergency room where he was treated with IV lorazepam. I tried him on lamictal, but he developed lip swelling  and it was discontinued. Prior to his headaches he has a prodrome of altered taste, dizziness, and numbness in his extremities. He can talk, but has decreased speech. At times he can see stars in his vision. He can "dream scotomata" and awaken in the morning with a headache.He sees colored dots during the headache. He is taking Reglan and Relpax as abortives.   He has difficulty with his memory. He notices depression and anxiety. He does not have an interest in sex and is anorgasmic. He has increasing problems with tremor. He is socially isolated and rarely leaves home.  He fell on concrete outside and fractured  the ulnar bone of his right hand requiring titanium plate and 8 screws by Doctor Sypher 08/2012. He uses left hand more than the right the doses increasing left hand tremor.  He does not want to go outside and walk his dog. His wife notes that he has anger irritability and disinhibition. His headache frequency  has decreased on his current regimen. He was on Depakote for 4 years and has also been on topiramate Doctor Elmer Picker reports no change in his cataracts.The patient complains of tearing during headaches. Most headaches are around  the right eye which can  stay sore for a month after the headache. He complains of increased salivation, tearing, and  "  fluid from all orifices with his headaches. He "sleeps "all of  the time", but doesn't sleep well.   REVIEW OF SYSTEMS: Full 14 system review of systems performed and notable only for ringing in ears cough fatigue fevers chills blurred vision headache numbness weakness slurred speech difficulty swallowing passing out tremor anxiety depression out of sleep disinterest in activities racing thoughts.  ALLERGIES: Allergies  Allergen Reactions  .  Antihistamines, Chlorpheniramine-Type     Low tolerance, reports that it gives him migraines   . Codeine     REACTION: RASH/JITTERS  Hallucinations  . Latex Other (See Comments)    Only if patient wears it himself, causes rash  . Shellfish Allergy     migraine  . Tetracyclines & Related Nausea And Vomiting    headache    HOME MEDICATIONS:  Outpatient Prescriptions Prior to Visit  Medication Sig Dispense Refill  . ALPHA LIPOIC ACID PO Take 600 mg by mouth 2 (two) times daily.      Marland Kitchen b complex vitamins capsule Take 1 capsule by mouth daily.      . beta carotene 16109 UNIT capsule Take 25,000 Units by mouth daily.      Tyrone Schimke, Vaccinium myrtillus, (BILBERRY PO) Take 200 mg by mouth daily.      [employer]onson Ing Products (ESTER-C/BIOFLAVONOIDS PO) Take 1 tablet by mouth 2 (two) times daily.      Marland Kitchen BLACK CURRANT SEED OIL PO Take 70 mg by mouth See admin instructions. Takes twice daily every three days (alternating with Lecithin and fish oil)      . Cholecalciferol (VITAMIN D) 2000 UNITS tablet Take 2,000 Units by mouth 2 (two) times daily.      . citalopram (CELEXA) 20 MG tablet Take 20 mg by mouth 2 (two) times daily.      . clonazePAM (KLONOPIN) 2 MG tablet Take 2-6 mg by mouth See admin instructions. 2 mg in the morning and 6 mg at bedtime      . Coenzyme Q10 (COQ-10) 200 MG CAPS Take 200 mg by mouth 2 (two) times daily.      Marland Kitchen eletriptan (RELPAX) 40 MG tablet Take 1 tablet (40 mg total) by mouth as needed for migraine (Do not exceed 2 per day or 4 per week).  8 tablet  0  . EPINEPHrine (EPIPEN) 0.3 mg/0.3 mL SOAJ Inject 0.3 mg into the muscle as needed (for allergic reactions).      . fenofibrate (TRICOR) 145 MG tablet Take 145 mg by mouth daily before lunch.       Marland Kitchen FENUGREEK PO Take 620 mg by mouth daily.      Marland Kitchen FEVERFEW PO Take 38 mg by mouth 2 (two) times daily.      . Garlic 600 MG CAPS Take 600 mg by mouth daily.      . Lecithin 1200 MG CAPS Take 1,200 capsules by mouth See  admin instructions. Takes twice daily every three days (alternating with Black current seeds and fish oil)      . metoCLOPramide (REGLAN) 10 MG tablet Take 10 mg by mouth 3 (three) times daily as needed (for nausea related headaches).       Marland Kitchen MILK THISTLE PO Take 600 mg by mouth daily.      . Misc Natural Products (DANDELION ROOT) 520 MG CAPS Take 520 mg by mouth daily.      . Misc Natural Products (LUTEIN 20) CAPS Take 20 mg by mouth daily.      Marland Kitchen  Multiple Vitamin (MULTIVITAMIN WITH MINERALS) TABS Take 1 tablet by mouth daily.      . Nutritional Supplements (PYCNOGENOL PO) Take 60 mg by mouth every other day.      Marland Kitchen OLIVE LEAF EXTRACT PO Take 410 mg by mouth daily.      . Omega-3 Fatty Acids (FISH OIL) 1200 MG CAPS Take 1,200 mg by mouth See admin instructions. Takes twice daily every three days (alternating with Lecithin and black currant seeds)      . Oral Electrolytes (THERMOTABS PO) Take 1 tablet by mouth daily as needed (uses when he exercise to maintain electrolytes).      Marland Kitchen OVER THE COUNTER MEDICATION Take 1 capsule by mouth 2 (two) times daily with a meal. Ultra flora (probiotic)      . OVER THE COUNTER MEDICATION Take 1 capsule by mouth 2 (two) times daily. wholemega (fish oil supplement)      . OVER THE COUNTER MEDICATION Take 1 tablet by mouth daily. Super cal-mag      . OVER THE COUNTER MEDICATION Take 1 capsule by mouth daily. ACES + zinc      . OVER THE COUNTER MEDICATION Take 99 mg by mouth daily. Potassium aspartate capsules      . Pantothenic Acid 500 MG TABS Take 500 mg by mouth daily.      Marland Kitchen pyridOXINE (VITAMIN B-6) 100 MG tablet Take 100 mg by mouth daily.      . riboflavin (VITAMIN B-2) 100 MG TABS Take 100 mg by mouth 4 (four) times daily.      . saw palmetto 160 MG capsule Take 160 mg by mouth 2 (two) times daily.      Marland Kitchen venlafaxine (EFFEXOR-XR) 150 MG 24 hr capsule Take 150 mg by mouth daily.        Marland Kitchen aspirin 81 MG chewable tablet Chew 81 mg by mouth daily.      .  ASTRAGALUS PO Take 470 mg by mouth daily.       No facility-administered medications prior to visit.    PAST MEDICAL HISTORY: Past Medical History  Diagnosis Date  . Head trauma     history of multiple  . Migraines     complex  . Depression   . Anxiety   . Allergy   . Hyperlipidemia   . Fibromyalgia   . Post traumatic stress disorder (PTSD)   . Migraines   . Spinal stenosis   . IBS (irritable bowel syndrome)   . Anxiety   . Tremors of nervous system   . Complication of anesthesia     difficult time waking up, cannot tolerate getting nerve block- pain related  . H/O echocardiogram     also had a tilt table test-showed orthostatic hypotension   . Orthostatic hypotension   . Shortness of breath   . GERD (gastroesophageal reflux disease)     uses peppermint on occasion   . Arthritis     lumbar spine stenosis   . Chronic pain   . Neurocardiogenic syncope   . Glaucoma     pre    PAST SURGICAL HISTORY: Past Surgical History  Procedure Laterality Date  . Lumbar laminectomy    . Trigger thumb release    . Tonsillectomy    . Closed reduction shoulder dislocation    . Thumb arthroscopy      both  . Ear cyst excision  08/09/2012    WRIST-R, NOT EARProcedure: CYST REMOVAL;  Surgeon: Wyn Forster., MD;  Location: [facility];  Service: Orthopedics;  Laterality: Right;  EXCISIONAL BIOPSY  . Orif radial fracture  08/09/2012    Procedure: OPEN REDUCTION INTERNAL FIXATION (ORIF) RADIAL FRACTURE;  Surgeon: Wyn Forster., MD;  Location: [facility];  Service: Orthopedics;  Laterality: Right;  . Back surgery  1996  . Hand nerve repair  1983    FAMILY HISTORY: Family History  Problem Relation Age of Onset  . Adopted: Yes    SOCIAL HISTORY:  History   Social History  . Marital Status: Married    Spouse Name: [name]    Number of Children: 0  . Years of Education: Post Grad   Occupational History  .      Disabled   Social History  Main Topics  . Smoking status: Never Smoker   . Smokeless tobacco: Never Used  . Alcohol Use: No  . Drug Use: No  . Sexual Activity: Not on file   Other Topics Concern  . Not on file   Social History Narrative   Patient lives at home with his spouse.   Caffeine Use: quit in 1995     PHYSICAL EXAM  Filed Vitals:   06/19/13 0914  BP: 122/82  Pulse: 54  Temp: 97.7 F (36.5 C)  Height: 5' 7.5" (1.715 m)  Weight: 173 lb (78.472 kg)    Not recorded    Body mass index is 26.68 kg/(m^2).  GENERAL EXAM: Patient is in no distress  CARDIOVASCULAR: Regular rate and rhythm, no murmurs, no carotid bruits  NEUROLOGIC: MENTAL STATUS: awake, alert, language fluent, comprehension intact, naming intact; PRESSURED SPEECH, SLIGHTLY TANGENTIAL. CRANIAL NERVE: no papilledema on fundoscopic exam, pupils equal and reactive to light, visual fields full to confrontation, extraocular muscles intact, no nystagmus, facial sensation and strength symmetric, uvula midline, shoulder shrug symmetric, tongue midline. MOTOR: normal bulk and tone, full strength in the BUE, BLE SENSORY: normal and symmetric to light touch COORDINATION: finger-nose-finger, fine finger movements normal REFLEXES: deep tendon reflexes present and symmetric GAIT/STATION: narrow based gait; romberg is negative   DIAGNOSTIC DATA (LABS, IMAGING, TESTING) - I reviewed patient records, labs, notes, testing and imaging myself where available.  Lab Results  Component Value Date   WBC 7.5 05/03/2013   HGB 15.3 05/03/2013   HCT 41.6 05/03/2013   MCV 88.3 05/03/2013   PLT 169 05/03/2013      Component Value Date/Time   NA 136 05/03/2013 1114   K 3.9 05/03/2013 1114   CL 99 05/03/2013 1114   CO2 27 05/03/2013 1114   GLUCOSE 93 05/03/2013 1114   BUN 15 05/03/2013 1114   CREATININE 1.32 05/03/2013 1114   CALCIUM 10.2 05/03/2013 1114   GFRNONAA 58* 05/03/2013 1114   GFRAA 67* 05/03/2013 1114   Lab Results  Component Value Date   CHOL 227*  08/04/2011   HDL 46 08/04/2011   LDLCALC 144* 08/04/2011   TRIG 184* 08/04/2011   CHOLHDL 4.9 08/04/2011   No results found for this basename: HGBA1C   No results found for this basename: VITAMINB12   No results found for this basename: TSH      ASSESSMENT AND PLAN  57 y.o. year old male here with complicated migraine, migraine with aura, chronic pain, chronic fatigue.  PLAN: - rx for sumatriptan and cambia (relpax not covered by insurance, and not working) - continue other meds - ok to proceed with lumbar spine surgery, but may consider waiting until migraines are in a lull  period   Return in about 6 months (around 12/17/2013) for with Heide Guile or Lynsey Ange.    Suanne Marker, MD 06/19/2013, 10:19 AM Certified in Neurology, Neurophysiology and Neuroimaging  [facility] [address], [address] [place], [place] [address] [phone]

## 2013-07-11 ENCOUNTER — Ambulatory Visit: Payer: Medicare Other | Admitting: Diagnostic Neuroimaging

## 2013-10-08 ENCOUNTER — Telehealth: Payer: Self-pay | Admitting: Neurology

## 2013-10-08 MED ORDER — PREDNISONE 5 MG PO TABS: ORAL_TABLET | ORAL | Status: DC

## 2013-10-08 NOTE — Telephone Encounter (Signed)
I called the patient. I talked with the wife. The patient has had more frequent headache in the last 2 weeks. I will call in a Rx for prednisone. May need to see Dr. Tish Frederickson in the office soon.

## 2013-10-09 ENCOUNTER — Telehealth: Payer: Self-pay | Admitting: Diagnostic Neuroimaging

## 2013-10-09 NOTE — Telephone Encounter (Signed)
Patient scheduled/ confirmed with wife

## 2013-10-09 NOTE — Telephone Encounter (Signed)
pls call pt and offer revisit in next few days or 1-2 weeks (me or Jeani Hawking). -VRP

## 2013-10-09 NOTE — Telephone Encounter (Signed)
RETURNING CALL  °

## 2013-10-09 NOTE — Telephone Encounter (Signed)
Called patient and left message concerning OV per Dr Leta Baptist, request

## 2013-10-10 ENCOUNTER — Encounter (INDEPENDENT_AMBULATORY_CARE_PROVIDER_SITE_OTHER): Payer: Self-pay

## 2013-10-10 ENCOUNTER — Encounter: Payer: Self-pay | Admitting: Diagnostic Neuroimaging

## 2013-10-10 ENCOUNTER — Ambulatory Visit (INDEPENDENT_AMBULATORY_CARE_PROVIDER_SITE_OTHER): Payer: Medicare Other | Admitting: Diagnostic Neuroimaging

## 2013-10-10 VITALS — BP 109/76 | HR 87 | Temp 98.3°F | Ht 67.5 in | Wt 170.5 lb

## 2013-10-10 DIAGNOSIS — H539 Unspecified visual disturbance: Secondary | ICD-10-CM

## 2013-10-10 DIAGNOSIS — R413 Other amnesia: Secondary | ICD-10-CM

## 2013-10-10 DIAGNOSIS — G43109 Migraine with aura, not intractable, without status migrainosus: Secondary | ICD-10-CM

## 2013-10-10 DIAGNOSIS — R51 Headache: Secondary | ICD-10-CM

## 2013-10-10 DIAGNOSIS — R200 Anesthesia of skin: Secondary | ICD-10-CM

## 2013-10-10 DIAGNOSIS — M542 Cervicalgia: Secondary | ICD-10-CM

## 2013-10-10 DIAGNOSIS — M79609 Pain in unspecified limb: Secondary | ICD-10-CM

## 2013-10-10 DIAGNOSIS — R209 Unspecified disturbances of skin sensation: Secondary | ICD-10-CM

## 2013-10-10 MED ORDER — RIZATRIPTAN BENZOATE 10 MG PO TBDP: 10.0000 mg | ORAL_TABLET | ORAL | Status: DC | PRN
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
## 2013-10-11 LAB — VITAMIN B12: VITAMIN B 12: 1010 pg/mL — AB (ref 211–946)

## 2013-10-11 LAB — TSH: TSH: 0.946 u[IU]/mL (ref 0.450–4.500)

## 2013-10-11 LAB — HEMOGLOBIN A1C
Est. average glucose Bld gHb Est-mCnc: 108 mg/dL
HEMOGLOBIN A1C: 5.4 % (ref 4.8–5.6)

## 2013-10-11 MED ORDER — TOPIRAMATE 50 MG PO TABS: 50.0000 mg | ORAL_TABLET | Freq: Two times a day (BID) | ORAL | Status: DC

## 2013-10-14 ENCOUNTER — Encounter: Payer: Self-pay | Admitting: Diagnostic Neuroimaging

## 2013-11-05 ENCOUNTER — Ambulatory Visit
Admission: RE | Admit: 2013-11-05 | Discharge: 2013-11-05 | Disposition: A | Discharge status: Home / Self Care | Payer: Medicare Other | Source: Ambulatory Visit | Attending: Diagnostic Neuroimaging | Admitting: Diagnostic Neuroimaging

## 2013-11-05 DIAGNOSIS — R51 Headache: Secondary | ICD-10-CM

## 2013-11-05 DIAGNOSIS — R200 Anesthesia of skin: Secondary | ICD-10-CM

## 2013-11-05 DIAGNOSIS — M79609 Pain in unspecified limb: Secondary | ICD-10-CM

## 2013-11-05 DIAGNOSIS — H539 Unspecified visual disturbance: Secondary | ICD-10-CM

## 2013-11-05 DIAGNOSIS — R413 Other amnesia: Secondary | ICD-10-CM

## 2013-11-05 DIAGNOSIS — G43109 Migraine with aura, not intractable, without status migrainosus: Secondary | ICD-10-CM

## 2013-11-05 DIAGNOSIS — M542 Cervicalgia: Secondary | ICD-10-CM

## 2013-11-05 MED ORDER — GADOBENATE DIMEGLUMINE 529 MG/ML IV SOLN
15.0000 mL | Freq: Once | INTRAVENOUS | Status: AC | PRN
  Administered 2013-11-05: 15 mL via INTRAVENOUS

## 2013-11-18 ENCOUNTER — Encounter: Payer: Self-pay | Admitting: Diagnostic Neuroimaging

## 2013-11-27 ENCOUNTER — Encounter: Payer: Self-pay | Admitting: Diagnostic Neuroimaging

## 2013-12-23 ENCOUNTER — Ambulatory Visit: Payer: Medicare Other | Admitting: Diagnostic Neuroimaging

## 2014-01-13 ENCOUNTER — Telehealth: Payer: Self-pay | Admitting: Diagnostic Neuroimaging

## 2014-01-13 NOTE — Telephone Encounter (Signed)
Patient's wife calling to state that patient wants sooner appointment than the one scheduled for 4/16/67m states that he had 6 migraines last week. Please call and advise patient.

## 2014-01-13 NOTE — Telephone Encounter (Signed)
Spoke with patient's wife.  Patient is sick.  Rescheduled to May 01, 2014 at 1:00 PM.

## 2014-01-15 ENCOUNTER — Other Ambulatory Visit: Payer: Self-pay | Admitting: Diagnostic Neuroimaging

## 2014-01-16 ENCOUNTER — Telehealth: Payer: Self-pay

## 2014-01-16 ENCOUNTER — Ambulatory Visit: Payer: Medicare Other | Admitting: Diagnostic Neuroimaging

## 2014-01-16 NOTE — Telephone Encounter (Signed)
[facility] contacted [place] requesting a Rx for Regaln 10mg  one po TID prn.  Last prescribed by Dr Erling Cruz in 2012.  Would you like to fill Rx?  Please advise.  Thank you.

## 2014-01-17 NOTE — Telephone Encounter (Signed)
Patient calling to inquire about his Metoclopramide 10 mg since he is completely out. Patient states he wants to have this sorted out before the weekend since he has had 4 migraines this week already. Please advise.

## 2014-01-20 NOTE — Telephone Encounter (Signed)
Yes, ok to fill. -VRP

## 2014-01-20 NOTE — Telephone Encounter (Signed)
Rx has been sent.  Pharmacy will notify patient when meds are ready for pick up.

## 2014-04-28 ENCOUNTER — Encounter: Payer: Self-pay | Admitting: Diagnostic Neuroimaging

## 2014-05-01 ENCOUNTER — Ambulatory Visit: Payer: Self-pay | Admitting: Diagnostic Neuroimaging

## 2014-05-07 ENCOUNTER — Ambulatory Visit: Payer: Medicare Other | Admitting: Diagnostic Neuroimaging

## 2014-07-03 ENCOUNTER — Other Ambulatory Visit: Payer: Self-pay | Admitting: Neurosurgery

## 2014-07-16 ENCOUNTER — Telehealth: Payer: Self-pay | Admitting: Diagnostic Neuroimaging

## 2014-07-16 ENCOUNTER — Ambulatory Visit: Payer: Medicare Other | Admitting: Diagnostic Neuroimaging

## 2014-07-16 NOTE — Telephone Encounter (Signed)
See phone note

## 2014-07-16 NOTE — Telephone Encounter (Signed)
Called patient to let him know Dr. Leta Baptist recommends that he stay on migraine medications, drink plenty of H20, and get plenty of rest.

## 2014-07-16 NOTE — Telephone Encounter (Signed)
I recommend to stay on migraine medications, get plenty of rest, drink lots of water. Let patient know. -VRP

## 2014-07-16 NOTE — Telephone Encounter (Signed)
Patient's spouse Carlos Mccall calling regarding upcoming surgery on 10/28.  Patient's questioning what does he need to be careful of in order to prevent Migraines during surgery and recuperation period.  Please call and advise.

## 2014-07-21 ENCOUNTER — Encounter: Payer: Self-pay | Admitting: Diagnostic Neuroimaging

## 2014-07-21 NOTE — Pre-Procedure Instructions (Addendum)
Carlos Mccall  07/21/2014   Your procedure is scheduled on: Wednesday, October 28.  Report to [facility] Admitting at 6:30AM.  Call this number if you have problems the morning of surgery: [phone]   Remember:   Do not eat food or drink liquids after midnight Tuesday, October 27.   Take these medicines the morning of surgery with A SIP OF WATER: venlafaxine (EFFEXOR-XR), ZOLOFT, KLONIPIN               Stop taking all Vitamins, Herbal Products, Aspirin and Aspirin Products, NSAIDs Aleve, Advil and  Diclofenac Potassium.               May take medication for migraines except Diclofenac Potassium (CAMBIA)   Do not wear jewelry, make-up or nail polish.  Do not wear lotions, powders, or perfumes.             Men may shave face and neck.  Do not bring valuables to the hospital.               [facility] is not responsible for any belongings or valuables.               Contacts, dentures or bridgework may not be worn into surgery.  Leave suitcase in the car. After surgery it may be brought to your room.  For patients admitted to the hospital, discharge time is determined by your treatment team.               Patients discharged the day of surgery will not be allowed to drive home.  Name and phone number of your driver: -   Special Instructions: Review  [facility] - Preparing For Surgery.   Please read over the following fact sheets that you were given: Pain Booklet, Coughing and Deep Breathing and Surgical Site Infection Prevention

## 2014-07-22 ENCOUNTER — Encounter (HOSPITAL_COMMUNITY): Payer: Self-pay

## 2014-07-22 ENCOUNTER — Encounter (HOSPITAL_COMMUNITY)
Admission: RE | Admit: 2014-07-22 | Discharge: 2014-07-22 | Disposition: A | Discharge status: Home / Self Care | Payer: Medicare Other | Source: Ambulatory Visit | Attending: Neurosurgery | Admitting: Neurosurgery

## 2014-07-22 DIAGNOSIS — Z01812 Encounter for preprocedural laboratory examination: Secondary | ICD-10-CM | POA: Insufficient documentation

## 2014-07-22 LAB — CBC
HCT: 47.7 % (ref 39.0–52.0)
Hemoglobin: 14.4 g/dL (ref 13.0–17.0)
MCH: 31.8 pg (ref 26.0–34.0)
MCHC: 30.2 g/dL (ref 30.0–36.0)
MCV: 105.3 fL — AB (ref 78.0–100.0)
PLATELETS: 237 10*3/uL (ref 150–400)
RBC: 4.53 MIL/uL (ref 4.22–5.81)
RDW: 13.4 % (ref 11.5–15.5)
WBC: 7.7 10*3/uL (ref 4.0–10.5)

## 2014-07-22 LAB — BASIC METABOLIC PANEL
Anion gap: 14 (ref 5–15)
BUN: 16 mg/dL (ref 6–23)
CALCIUM: 10.3 mg/dL (ref 8.4–10.5)
CO2: 25 mEq/L (ref 19–32)
CREATININE: 1.29 mg/dL (ref 0.50–1.35)
Chloride: 98 mEq/L (ref 96–112)
GFR calc non Af Amer: 59 mL/min — ABNORMAL LOW (ref >90)
GFR, EST AFRICAN AMERICAN: 69 mL/min — AB (ref >90)
Glucose, Bld: 90 mg/dL (ref 70–99)
Potassium: 5 mEq/L (ref 3.7–5.3)
Sodium: 137 mEq/L (ref 137–147)

## 2014-07-22 LAB — SURGICAL PCR SCREEN
MRSA, PCR: NEGATIVE
Staphylococcus aureus: NEGATIVE

## 2014-07-28 ENCOUNTER — Encounter: Payer: Self-pay | Admitting: Diagnostic Neuroimaging

## 2014-07-29 MED ORDER — CEFAZOLIN SODIUM-DEXTROSE 2-3 GM-% IV SOLR
2.0000 g | INTRAVENOUS | Status: AC
  Administered 2014-07-30: 2 g via INTRAVENOUS
  Filled 2014-07-29: qty 50

## 2014-07-29 NOTE — H&P (Signed)
Carlos Mccall is an 58 y.o. male.   Chief Complaint: bilateral leg pain HPI: patient who has been complaining of lumbar pain with extension to both legs with no improvement which included medications and epidural injections. He had a lumbar discectomy by me about 20 y/a. He wants to go ahead with surgery because he feels he is getting worse  Past Medical History  Diagnosis Date  . Head trauma     history of multiple  . Migraines     complex  . Depression   . Allergy   . Hyperlipidemia   . Post traumatic stress disorder (PTSD)   . Migraines   . Spinal stenosis   . IBS (irritable bowel syndrome)   . Tremors of nervous system   . Complication of anesthesia     difficult time waking up, cannot tolerate getting nerve block- pain related  . H/O echocardiogram     also had a tilt table test-showed orthostatic hypotension   . Orthostatic hypotension   . Shortness of breath   . GERD (gastroesophageal reflux disease)     uses peppermint on occasion   . Chronic pain   . Neurocardiogenic syncope   . Glaucoma     pre  . Fibromyalgia     CHRONIC FATIGUE SYNDROME  . Anxiety     PANIC EPISODES  . Anxiety   . Arthritis     lumbar spine stenosis     Past Surgical History  Procedure Laterality Date  . Lumbar laminectomy    . Trigger thumb release    . Tonsillectomy    . Closed reduction shoulder dislocation    . Thumb arthroscopy      both  . Ear cyst excision  08/09/2012    WRIST-R, NOT EARProcedure: CYST REMOVAL;  Surgeon: Cammie Sickle., MD;  Location: [facility];  Service: Orthopedics;  Laterality: Right;  EXCISIONAL BIOPSY  . Orif radial fracture  08/09/2012    Procedure: OPEN REDUCTION INTERNAL FIXATION (ORIF) RADIAL FRACTURE;  Surgeon: Cammie Sickle., MD;  Location: [facility];  Service: Orthopedics;  Laterality: Right;  . Back surgery  1996  . Hand nerve repair  1983    Family History  Problem Relation Age of Onset  . Adopted: Yes    Social History:  reports that he has never smoked. He has never used smokeless tobacco. He reports that he does not drink alcohol or use illicit drugs.  Allergies:  Allergies  Allergen Reactions  . Antihistamines, Chlorpheniramine-Type     Low tolerance, reports that it gives him migraines   . Codeine     REACTION: RASH/JITTERS  Hallucinations COUGH MEDS  . Latex Other (See Comments)    Only if patient wears it himself, causes rash  . Quinine Derivatives Other (See Comments)    migraine  . Shellfish Allergy     migraine  . Tetracyclines & Related Nausea And Vomiting    headache  . Thimerosal     No prescriptions prior to admission    No results found for this or any previous visit (from the past 48 hour(s)). No results found.  Review of Systems  Constitutional: Negative.   Eyes: Negative.   Cardiovascular: Negative.   Gastrointestinal: Negative.   Genitourinary: Negative.   Musculoskeletal: Positive for back pain and joint pain.  Skin: Negative.   Neurological: Positive for sensory change and focal weakness.  Psychiatric/Behavioral: Negative.     There were no vitals taken  for this visit. Physical Exam hent, nl . Neck, nl. Cv, nl. Lungs, clear. Abdomen, soft. Extremities, nl. NEURO weakness od DF noth feet. SLR positive bilatrerraly at 45 degrees. radiological study shows severe stenosis at l4-5  Assessment/Plan patieent to go ahead with l4-5 laminectomies and foraminotomies. He and his wife are aware of risks and benefits  Candida Vetter M 07/29/2014, 6:03 PM

## 2014-07-30 ENCOUNTER — Inpatient Hospital Stay (HOSPITAL_COMMUNITY)
Admission: RE | Admit: 2014-07-30 | Discharge: 2014-08-01 | DRG: 517 | Disposition: A | Discharge status: Home Health | Payer: Medicare Other | Source: Ambulatory Visit | Attending: Neurosurgery | Admitting: Neurosurgery

## 2014-07-30 ENCOUNTER — Ambulatory Visit (HOSPITAL_COMMUNITY): Payer: Medicare Other | Admitting: Certified Registered Nurse Anesthetist

## 2014-07-30 ENCOUNTER — Encounter (HOSPITAL_COMMUNITY)
Admission: RE | Disposition: A | Discharge status: Home Health | Payer: Self-pay | Source: Ambulatory Visit | Attending: Neurosurgery

## 2014-07-30 ENCOUNTER — Encounter (HOSPITAL_COMMUNITY): Payer: Medicare Other | Admitting: Certified Registered Nurse Anesthetist

## 2014-07-30 ENCOUNTER — Encounter (HOSPITAL_COMMUNITY): Payer: Self-pay | Admitting: Certified Registered Nurse Anesthetist

## 2014-07-30 ENCOUNTER — Ambulatory Visit (HOSPITAL_COMMUNITY): Payer: Medicare Other

## 2014-07-30 DIAGNOSIS — F329 Major depressive disorder, single episode, unspecified: Secondary | ICD-10-CM | POA: Diagnosis present

## 2014-07-30 DIAGNOSIS — M1990 Unspecified osteoarthritis, unspecified site: Secondary | ICD-10-CM | POA: Diagnosis present

## 2014-07-30 DIAGNOSIS — Z7982 Long term (current) use of aspirin: Secondary | ICD-10-CM

## 2014-07-30 DIAGNOSIS — M48061 Spinal stenosis, lumbar region without neurogenic claudication: Secondary | ICD-10-CM | POA: Diagnosis present

## 2014-07-30 DIAGNOSIS — M545 Low back pain: Secondary | ICD-10-CM | POA: Diagnosis present

## 2014-07-30 DIAGNOSIS — E785 Hyperlipidemia, unspecified: Secondary | ICD-10-CM | POA: Diagnosis present

## 2014-07-30 DIAGNOSIS — G8929 Other chronic pain: Secondary | ICD-10-CM | POA: Diagnosis present

## 2014-07-30 DIAGNOSIS — Z79899 Other long term (current) drug therapy: Secondary | ICD-10-CM

## 2014-07-30 DIAGNOSIS — K219 Gastro-esophageal reflux disease without esophagitis: Secondary | ICD-10-CM | POA: Diagnosis present

## 2014-07-30 DIAGNOSIS — Z419 Encounter for procedure for purposes other than remedying health state, unspecified: Secondary | ICD-10-CM

## 2014-07-30 DIAGNOSIS — F419 Anxiety disorder, unspecified: Secondary | ICD-10-CM | POA: Diagnosis present

## 2014-07-30 DIAGNOSIS — M5416 Radiculopathy, lumbar region: Secondary | ICD-10-CM | POA: Diagnosis present

## 2014-07-30 DIAGNOSIS — M4806 Spinal stenosis, lumbar region: Principal | ICD-10-CM | POA: Diagnosis present

## 2014-07-30 DIAGNOSIS — F4310 Post-traumatic stress disorder, unspecified: Secondary | ICD-10-CM | POA: Diagnosis present

## 2014-07-30 HISTORY — PX: LUMBAR LAMINECTOMY/DECOMPRESSION MICRODISCECTOMY: SHX5026

## 2014-07-30 SURGERY — LUMBAR LAMINECTOMY/DECOMPRESSION MICRODISCECTOMY 1 LEVEL
Anesthesia: General | Site: Back | Laterality: Bilateral

## 2014-07-30 MED ORDER — BUPIVACAINE LIPOSOME 1.3 % IJ SUSP
20.0000 mL | INTRAMUSCULAR | Status: AC
  Filled 2014-07-30: qty 20

## 2014-07-30 MED ORDER — ONDANSETRON HCL 4 MG/2ML IJ SOLN
INTRAMUSCULAR | Status: DC | PRN
  Administered 2014-07-30: 4 mg via INTRAVENOUS

## 2014-07-30 MED ORDER — SODIUM CHLORIDE 0.9 % IJ SOLN
INTRAMUSCULAR | Status: AC
  Filled 2014-07-30: qty 10

## 2014-07-30 MED ORDER — ONDANSETRON HCL 4 MG/2ML IJ SOLN: 4.0000 mg | INTRAMUSCULAR | Status: DC | PRN

## 2014-07-30 MED ORDER — VENLAFAXINE HCL ER 150 MG PO CP24
150.0000 mg | ORAL_CAPSULE | Freq: Every day | ORAL | Status: DC
  Administered 2014-07-31 – 2014-08-01 (×2): 150 mg via ORAL
  Filled 2014-07-30 (×2): qty 1

## 2014-07-30 MED ORDER — DIAZEPAM 5 MG PO TABS: 5.0000 mg | ORAL_TABLET | Freq: Four times a day (QID) | ORAL | Status: DC | PRN

## 2014-07-30 MED ORDER — ROCURONIUM BROMIDE 50 MG/5ML IV SOLN
INTRAVENOUS | Status: AC
  Filled 2014-07-30: qty 1

## 2014-07-30 MED ORDER — ROCURONIUM BROMIDE 100 MG/10ML IV SOLN
INTRAVENOUS | Status: DC | PRN
  Administered 2014-07-30: 50 mg via INTRAVENOUS

## 2014-07-30 MED ORDER — FENTANYL CITRATE 0.05 MG/ML IJ SOLN
INTRAMUSCULAR | Status: DC | PRN
  Administered 2014-07-30: 200 ug via INTRAVENOUS
  Administered 2014-07-30: 50 ug via INTRAVENOUS

## 2014-07-30 MED ORDER — THROMBIN 5000 UNITS EX SOLR
CUTANEOUS | Status: DC | PRN
  Administered 2014-07-30 (×2): 5000 [IU] via TOPICAL

## 2014-07-30 MED ORDER — EPHEDRINE SULFATE 50 MG/ML IJ SOLN
INTRAMUSCULAR | Status: DC | PRN
  Administered 2014-07-30 (×3): 10 mg via INTRAVENOUS

## 2014-07-30 MED ORDER — 0.9 % SODIUM CHLORIDE (POUR BTL) OPTIME
TOPICAL | Status: DC | PRN
  Administered 2014-07-30: 1000 mL

## 2014-07-30 MED ORDER — SODIUM CHLORIDE 0.9 % IJ SOLN
3.0000 mL | Freq: Two times a day (BID) | INTRAMUSCULAR | Status: DC
  Administered 2014-07-30: 3 mL via INTRAVENOUS

## 2014-07-30 MED ORDER — EPHEDRINE SULFATE 50 MG/ML IJ SOLN
INTRAMUSCULAR | Status: AC
  Filled 2014-07-30: qty 1

## 2014-07-30 MED ORDER — OXYCODONE-ACETAMINOPHEN 5-325 MG PO TABS
1.0000 | ORAL_TABLET | ORAL | Status: DC | PRN
  Administered 2014-07-30: 1 via ORAL
  Administered 2014-07-31: 2 via ORAL
  Filled 2014-07-30: qty 1
  Filled 2014-07-30: qty 2

## 2014-07-30 MED ORDER — SODIUM CHLORIDE 0.9 % IV SOLN
INTRAVENOUS | Status: DC
  Administered 2014-07-30: 13:00:00 via INTRAVENOUS

## 2014-07-30 MED ORDER — PROPOFOL 10 MG/ML IV BOLUS
INTRAVENOUS | Status: AC
  Filled 2014-07-30: qty 20

## 2014-07-30 MED ORDER — MENTHOL 3 MG MT LOZG: 1.0000 | LOZENGE | OROMUCOSAL | Status: DC | PRN

## 2014-07-30 MED ORDER — ONDANSETRON HCL 4 MG/2ML IJ SOLN
INTRAMUSCULAR | Status: AC
  Filled 2014-07-30: qty 2

## 2014-07-30 MED ORDER — NEOSTIGMINE METHYLSULFATE 10 MG/10ML IV SOLN
INTRAVENOUS | Status: DC | PRN
  Administered 2014-07-30: 3 mg via INTRAVENOUS

## 2014-07-30 MED ORDER — MIDAZOLAM HCL 5 MG/5ML IJ SOLN
INTRAMUSCULAR | Status: DC | PRN
Start: 2014-07-30 — End: 2014-07-30
  Administered 2014-07-30: 2 mg via INTRAVENOUS

## 2014-07-30 MED ORDER — CLONAZEPAM 1 MG PO TABS
6.0000 mg | ORAL_TABLET | Freq: Every day | ORAL | Status: DC
  Administered 2014-07-30 – 2014-07-31 (×2): 6 mg via ORAL
  Filled 2014-07-30 (×2): qty 6

## 2014-07-30 MED ORDER — LIDOCAINE HCL (CARDIAC) 20 MG/ML IV SOLN
INTRAVENOUS | Status: AC
  Filled 2014-07-30: qty 5

## 2014-07-30 MED ORDER — ACETAMINOPHEN 325 MG PO TABS
650.0000 mg | ORAL_TABLET | ORAL | Status: DC | PRN
  Administered 2014-07-30 – 2014-07-31 (×3): 650 mg via ORAL
  Filled 2014-07-30 (×3): qty 2

## 2014-07-30 MED ORDER — LIDOCAINE HCL (CARDIAC) 20 MG/ML IV SOLN
INTRAVENOUS | Status: DC | PRN
  Administered 2014-07-30: 60 mg via INTRAVENOUS

## 2014-07-30 MED ORDER — ACETAMINOPHEN 650 MG RE SUPP: 650.0000 mg | RECTAL | Status: DC | PRN

## 2014-07-30 MED ORDER — BUPIVACAINE LIPOSOME 1.3 % IJ SUSP
INTRAMUSCULAR | Status: DC | PRN
  Administered 2014-07-30: 20 mL

## 2014-07-30 MED ORDER — MIDAZOLAM HCL 2 MG/2ML IJ SOLN
INTRAMUSCULAR | Status: AC
  Filled 2014-07-30: qty 2

## 2014-07-30 MED ORDER — CLONAZEPAM 2 MG PO TABS: 2.0000 mg | ORAL_TABLET | ORAL | Status: DC

## 2014-07-30 MED ORDER — CEFAZOLIN SODIUM 1-5 GM-% IV SOLN
1.0000 g | Freq: Three times a day (TID) | INTRAVENOUS | Status: AC
  Administered 2014-07-30 – 2014-07-31 (×2): 1 g via INTRAVENOUS
  Filled 2014-07-30 (×2): qty 50

## 2014-07-30 MED ORDER — PROPOFOL 10 MG/ML IV BOLUS
INTRAVENOUS | Status: DC | PRN
  Administered 2014-07-30: 200 mg via INTRAVENOUS

## 2014-07-30 MED ORDER — FENTANYL CITRATE 0.05 MG/ML IJ SOLN
INTRAMUSCULAR | Status: AC
  Filled 2014-07-30: qty 5

## 2014-07-30 MED ORDER — LACTATED RINGERS IV SOLN
INTRAVENOUS | Status: DC | PRN
  Administered 2014-07-30: 09:00:00 via INTRAVENOUS

## 2014-07-30 MED ORDER — MORPHINE SULFATE 2 MG/ML IJ SOLN: 1.0000 mg | INTRAMUSCULAR | Status: DC | PRN

## 2014-07-30 MED ORDER — PHENOL 1.4 % MT LIQD
1.0000 | OROMUCOSAL | Status: DC | PRN
  Administered 2014-07-31: 1 via OROMUCOSAL
  Filled 2014-07-30: qty 177

## 2014-07-30 MED ORDER — CLONAZEPAM 1 MG PO TABS
2.0000 mg | ORAL_TABLET | Freq: Every day | ORAL | Status: DC
  Administered 2014-07-30 – 2014-08-01 (×3): 2 mg via ORAL
  Filled 2014-07-30 (×3): qty 2

## 2014-07-30 MED ORDER — SODIUM CHLORIDE 0.9 % IV SOLN: 250.0000 mL | INTRAVENOUS | Status: DC

## 2014-07-30 MED ORDER — SERTRALINE HCL 50 MG PO TABS
25.0000 mg | ORAL_TABLET | Freq: Every day | ORAL | Status: DC
  Administered 2014-07-30 – 2014-07-31 (×2): 25 mg via ORAL
  Filled 2014-07-30 (×2): qty 1

## 2014-07-30 MED ORDER — ELETRIPTAN HYDROBROMIDE 40 MG PO TABS
40.0000 mg | ORAL_TABLET | ORAL | Status: DC | PRN
  Filled 2014-07-30: qty 1

## 2014-07-30 MED ORDER — ASPIRIN 81 MG PO CHEW
CHEWABLE_TABLET | ORAL | Status: AC
  Filled 2014-07-30: qty 3

## 2014-07-30 MED ORDER — HEMOSTATIC AGENTS (NO CHARGE) OPTIME
TOPICAL | Status: DC | PRN
  Administered 2014-07-30: 1 via TOPICAL

## 2014-07-30 MED ORDER — GLYCOPYRROLATE 0.2 MG/ML IJ SOLN
INTRAMUSCULAR | Status: DC | PRN
  Administered 2014-07-30: 0.4 mg via INTRAVENOUS

## 2014-07-30 MED ORDER — SODIUM CHLORIDE 0.9 % IJ SOLN: 3.0000 mL | INTRAMUSCULAR | Status: DC | PRN

## 2014-07-30 SURGICAL SUPPLY — 58 items
BENZOIN TINCTURE PRP APPL 2/3 (GAUZE/BANDAGES/DRESSINGS) ×3 IMPLANT
BLADE CLIPPER SURG (BLADE) ×3 IMPLANT
BUR ACORN 6.0 (BURR) ×2 IMPLANT
BUR ACORN 6.0MM (BURR) ×1
BUR MATCHSTICK NEURO 3.0 LAGG (BURR) ×3 IMPLANT
CANISTER SUCT 3000ML (MISCELLANEOUS) ×3 IMPLANT
CLOSURE WOUND 1/2 X4 (GAUZE/BANDAGES/DRESSINGS) ×1
CONT SPEC 4OZ CLIKSEAL STRL BL (MISCELLANEOUS) ×3 IMPLANT
DRAPE LAPAROTOMY 100X72X124 (DRAPES) ×3 IMPLANT
DRAPE MICROSCOPE LEICA (MISCELLANEOUS) ×3 IMPLANT
DRAPE POUCH INSTRU U-SHP 10X18 (DRAPES) ×3 IMPLANT
DRSG AQUACEL AG ADV 3.5X10 (GAUZE/BANDAGES/DRESSINGS) ×3 IMPLANT
DRSG PAD ABDOMINAL 8X10 ST (GAUZE/BANDAGES/DRESSINGS) IMPLANT
DURAPREP 26ML APPLICATOR (WOUND CARE) ×3 IMPLANT
ELECT REM PT RETURN 9FT ADLT (ELECTROSURGICAL) ×3
ELECTRODE REM PT RTRN 9FT ADLT (ELECTROSURGICAL) ×1 IMPLANT
EVACUATOR 1/8 PVC DRAIN (DRAIN) ×3 IMPLANT
GAUZE SPONGE 4X4 12PLY STRL (GAUZE/BANDAGES/DRESSINGS) ×3 IMPLANT
GAUZE SPONGE 4X4 16PLY XRAY LF (GAUZE/BANDAGES/DRESSINGS) IMPLANT
GLOVE BIOGEL M 8.0 STRL (GLOVE) IMPLANT
GLOVE BIOGEL PI IND STRL 7.0 (GLOVE) ×1 IMPLANT
GLOVE BIOGEL PI INDICATOR 7.0 (GLOVE) ×2
GLOVE EXAM NITRILE LRG STRL (GLOVE) IMPLANT
GLOVE EXAM NITRILE MD LF STRL (GLOVE) IMPLANT
GLOVE EXAM NITRILE XL STR (GLOVE) IMPLANT
GLOVE EXAM NITRILE XS STR PU (GLOVE) IMPLANT
GLOVE SURG SS PI 7.0 STRL IVOR (GLOVE) ×6 IMPLANT
GLOVE SURG SS PI 8.0 STRL IVOR (GLOVE) ×6 IMPLANT
GOWN STRL REUS W/ TWL LRG LVL3 (GOWN DISPOSABLE) ×2 IMPLANT
GOWN STRL REUS W/ TWL XL LVL3 (GOWN DISPOSABLE) ×1 IMPLANT
GOWN STRL REUS W/TWL 2XL LVL3 (GOWN DISPOSABLE) IMPLANT
GOWN STRL REUS W/TWL LRG LVL3 (GOWN DISPOSABLE) ×4
GOWN STRL REUS W/TWL XL LVL3 (GOWN DISPOSABLE) ×2
KIT BASIN OR (CUSTOM PROCEDURE TRAY) ×3 IMPLANT
KIT ROOM TURNOVER OR (KITS) ×3 IMPLANT
NEEDLE HYPO 18GX1.5 BLUNT FILL (NEEDLE) IMPLANT
NEEDLE HYPO 21X1.5 SAFETY (NEEDLE) ×3 IMPLANT
NEEDLE HYPO 25X1 1.5 SAFETY (NEEDLE) IMPLANT
NEEDLE SPNL 20GX3.5 QUINCKE YW (NEEDLE) IMPLANT
NS IRRIG 1000ML POUR BTL (IV SOLUTION) ×3 IMPLANT
PACK LAMINECTOMY NEURO (CUSTOM PROCEDURE TRAY) ×3 IMPLANT
PAD ARMBOARD 7.5X6 YLW CONV (MISCELLANEOUS) ×9 IMPLANT
PATTIES SURGICAL .5 X1 (DISPOSABLE) IMPLANT
RUBBERBAND STERILE (MISCELLANEOUS) ×6 IMPLANT
SPONGE LAP 4X18 X RAY DECT (DISPOSABLE) IMPLANT
SPONGE SURGIFOAM ABS GEL SZ50 (HEMOSTASIS) ×3 IMPLANT
STAPLER VISISTAT 35W (STAPLE) ×3 IMPLANT
STRIP CLOSURE SKIN 1/2X4 (GAUZE/BANDAGES/DRESSINGS) ×2 IMPLANT
SUT VIC AB 0 CT1 18XCR BRD8 (SUTURE) ×1 IMPLANT
SUT VIC AB 0 CT1 8-18 (SUTURE) ×2
SUT VIC AB 2-0 CP2 18 (SUTURE) ×3 IMPLANT
SUT VIC AB 3-0 SH 8-18 (SUTURE) ×3 IMPLANT
SYR 20CC LL (SYRINGE) ×3 IMPLANT
SYR 20ML ECCENTRIC (SYRINGE) ×3 IMPLANT
SYR 5ML LL (SYRINGE) IMPLANT
TOWEL OR 17X24 6PK STRL BLUE (TOWEL DISPOSABLE) ×3 IMPLANT
TOWEL OR 17X26 10 PK STRL BLUE (TOWEL DISPOSABLE) ×3 IMPLANT
WATER STERILE IRR 1000ML POUR (IV SOLUTION) ×3 IMPLANT

## 2014-07-30 NOTE — Transfer of Care (Signed)
Immediate Anesthesia Transfer of Care Note  Patient: Carlos Mccall  Procedure(s) Performed: Procedure(s) with comments: Bilateral Lumbar four-five Laminectomy/Foraminotomy (Bilateral) - Bilateral Lumbar four-five Laminectomy/Foraminotomy  Patient Location: PACU  Anesthesia Type:General  Level of Consciousness: awake  Airway & Oxygen Therapy: Patient Spontanous Breathing and Patient connected to nasal cannula oxygen  Post-op Assessment: Report given to PACU RN and Post -op Vital signs reviewed and stable  Post vital signs: Reviewed and stable  Complications: No apparent anesthesia complications

## 2014-07-30 NOTE — Op Note (Signed)
NAMECUTHBERT, TURTON NO.:  [id number]  MEDICAL RECORD NO.:  [id number]  LOCATION:  [id number]                        FACILITY:  [facility]  PHYSICIAN:  Leeroy Cha, M.D.   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  Chronic radiculopathy. Status post left 5-1 diskectomy 25 years ago.  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 Chronic radiculopathy. Status post left 5-1 diskectomy 25 years ago.  PROCEDURE:  Bilateral L4-5 laminectomy, decompression of the thecal sac bilaterally, foraminotomy to decompress the L4, L5, and S1 nerve root bilaterally.  SURGEON:  Leeroy Cha, MD  CLINICAL HISTORY:  Mr. Traweek is a gentleman who had surgery by me about 25 years ago because of left L5-S1 herniated disk.  For the past year, he had been complaining of back pain worsened to both legs.  He has failed with conservative treatment including epidural injection.  X- rays showed that he has severe stenosis at L4-5.  The patient and his wife knew the risks and benefits of surgery.  DESCRIPTION OF PROCEDURE:  The patient was taken to the OR, and after intubation, he was positioned in a prone manner.  The back was cleaned with Betadine and later with DuraPrep.  Then, a midline incision following the previous one was made from L4 down to L5-S1.  Muscles were retracted laterally.  Hemostasis was done with bipolar.  We identified the L5-S1 space and the area where he had surgery.  From then on, we removed the spinous process of L4 and L5.  With the drill and the Kerrison punch, we removed the lamina of 4-5 bilaterally.  The patient had quite a bit of adhesion in the left 5-1.  Lysis was accomplished. The decompression was done laterally up to the point that we had good space for the thecal sac.  The foramina were opened and we had plenty of space  for the L4, L5, and S1 nerve root.  The area was irrigated.  X- rays showed that we were in the right area.  Valsalva maneuver was negative.  He has been taking at least 30 over-the-counter medications. I left a small drain in the epidural space.  The wound was closed with Vicryl and staples.          ______________________________ Leeroy Cha, M.D.     EB/MEDQ  D:  07/30/2014  T:  07/30/2014  Job:  [id number]

## 2014-07-30 NOTE — Progress Notes (Signed)
Utilization review completed.  

## 2014-07-30 NOTE — Anesthesia Preprocedure Evaluation (Signed)
Anesthesia Evaluation  Patient identified by MRN, date of birth, ID band Patient awake, Patient confused and Patient unresponsive    Reviewed: Allergy & Precautions, H&P , NPO status , Patient's Chart, lab work & pertinent test results  Airway        Dental   Pulmonary          Cardiovascular     Neuro/Psych    GI/Hepatic   Endo/Other    Renal/GU      Musculoskeletal   Abdominal   Peds  Hematology   Anesthesia Other Findings   Reproductive/Obstetrics                             Anesthesia Physical Anesthesia Plan  ASA: III  Anesthesia Plan: General   Post-op Pain Management:    Induction:   Airway Management Planned:   Additional Equipment:   Intra-op Plan:   Post-operative Plan:   Informed Consent:   Plan Discussed with:   Anesthesia Plan Comments:         Anesthesia Quick Evaluation

## 2014-07-30 NOTE — Progress Notes (Signed)
Pt reported to this nurse bladder discomfort and distention. Abdominal assessment done soft but distended. Performed bladder scan 998 ml residual.  Straight cath performed per Md order. Pt stated that he felt relief.  Abdomen reassessed soft, non distended. Will continue to monitor.

## 2014-07-30 NOTE — Anesthesia Postprocedure Evaluation (Signed)
  Anesthesia Post-op Note  Patient: Carlos Mccall  Procedure(s) Performed: Procedure(s) with comments: Bilateral Lumbar four-five Laminectomy/Foraminotomy (Bilateral) - Bilateral Lumbar four-five Laminectomy/Foraminotomy  Patient Location: PACU  Anesthesia Type:General  Level of Consciousness: awake, alert , oriented and patient cooperative  Airway and Oxygen Therapy: Patient Spontanous Breathing and Patient connected to nasal cannula oxygen  Post-op Pain: none  Post-op Assessment: Post-op Vital signs reviewed, Patient's Cardiovascular Status Stable, Respiratory Function Stable, Patent Airway, No signs of Nausea or vomiting and Pain level controlled  Post-op Vital Signs: Reviewed and stable  Last Vitals:  Filed Vitals:   07/30/14 1015  BP: 131/69  Pulse: 30  Temp:   Resp: 15    Complications: No apparent anesthesia complications

## 2014-07-30 NOTE — Progress Notes (Signed)
Pt alert, verbal oriented x4 with no complaints voiced at this time. Pt stable.  Pt oriented to room. Safety measures in place. Call bell within reach. Will continue to monitor.

## 2014-07-30 NOTE — Progress Notes (Signed)
Pt received at 10:47 from PACU via stretcher alert, oriented x 4 with no noted distress. Report received by nurse Faythe Dingwall,  RN. Pt denied pain or discomfort. Dressing clean, dry and intact to lumbar. Hemovac in place. Pt stable at this time. Will continue to monitor.

## 2014-07-31 MED ORDER — CYCLOBENZAPRINE HCL 10 MG PO TABS
10.0000 mg | ORAL_TABLET | Freq: Three times a day (TID) | ORAL | Status: DC
  Administered 2014-07-31 – 2014-08-01 (×4): 10 mg via ORAL
  Filled 2014-07-31 (×4): qty 1

## 2014-07-31 NOTE — Progress Notes (Signed)
Noted pt sitting up in chair alert, verbal with no complaints of pain or discomfort from PT this am. Pt stated that during transfer to chair, his hemovac drain came out.  Hemovac drain noted lying on his bed. Emptied 10cc of blood. Md notified stated that he was aware had a visit with pt this am. Surgical dressing to back dry and intact. Will continue to monitor.

## 2014-07-31 NOTE — Progress Notes (Signed)
Received new orders from nurse Becky from [place] to dc Diazepam and to start Flexeril. Pt and wife made aware.  Will continue to monitor.

## 2014-07-31 NOTE — Evaluation (Signed)
Physical Therapy Evaluation Patient Details Name: Carlos Mccall MRN: [id number] DOB: December 20, 1955 Today's Date: 07/31/2014   History of Present Illness  Patient is a 58 y/o male s/p L4-5 laminectomy and foraminotomy.   Clinical Impression  Patient presents with functional limitations due to deficits listed in PT problem list (see below). Pt with generalized weakness and pain impacting safe mobility. Difficulty with transfers due to pain in back with movement. Education provided on back precautions. Gave handout. Pt able to recall 2/3precautions within session. Need to assess ability to negotiate steps next session prior to d/c. Pt would benefit from acute PT and follow up HHPT to improve transfers, gait, balance and overall safe mobility so pt can maximize independence and return to PLOF. Hx of falls.    Follow Up Recommendations Home health PT;Supervision/Assistance - 24 hour    Equipment Recommendations  None recommended by PT    Recommendations for Other Services OT consult     Precautions / Restrictions Precautions Precautions: Back Precaution Booklet Issued: Yes (comment) Precaution Comments: Education on back precautions, gave handout. Restrictions Weight Bearing Restrictions: No      Mobility  Bed Mobility Overal bed mobility: Needs Assistance Bed Mobility: Rolling;Sidelying to Sit Rolling: Min assist Sidelying to sit: Min guard       General bed mobility comments: HOB flat, no use of rails to simulate home environment. VC's for technique and sequencing to perform log roll technique. Increased time due to pain.  Transfers Overall transfer level: Needs assistance Equipment used: None Transfers: Sit to/from Stand Sit to Stand: Min guard         General transfer comment: Min guard for safety. Stood from [employer].  Ambulation/Gait Ambulation/Gait assistance: Min guard Ambulation Distance (Feet): 150 Feet Assistive device: None Gait Pattern/deviations:  Step-through pattern;Decreased stride length   Gait velocity interpretation: Below normal speed for age/gender General Gait Details: Slow, guarded gait due to pain. Mildly unsteady but no LOB.   Stairs            Wheelchair Mobility    Modified Rankin (Stroke Patients Only)       Balance Overall balance assessment: Needs assistance   Sitting balance-Leahy Scale: Good     Standing balance support: During functional activity Standing balance-Leahy Scale: Fair                               Pertinent Vitals/Pain Pain Assessment: 0-10 Pain Score: 7  Pain Location: back Pain Descriptors / Indicators: Sore;Aching Pain Intervention(s): Limited activity within patient's tolerance;Monitored during session;Repositioned    Home Living Family/patient expects to be discharged to:: Private residence Living Arrangements: Spouse/significant other Available Help at Discharge: Family;Available PRN/intermittently Type of Home: House Home Access: Stairs to enter Entrance Stairs-Rails: Right Entrance Stairs-Number of Steps: 10 + 10 with landing Home Layout: One level Home Equipment: Crutches      Prior Function Level of Independence: Needs assistance   Gait / Transfers Assistance Needed: Reports minimal activity the last couple months - mainly household ambulator. Reports falls.  ADL's / Homemaking Assistance Needed: Requires assist with dressing/bathing mostly due to pain.        Hand Dominance   Dominant Hand: Right    Extremity/Trunk Assessment   Upper Extremity Assessment: Overall WFL for tasks assessed           Lower Extremity Assessment: Generalized weakness (Lacking full knee extension bilaterally - tightness in Bil hamstrings.)  Cervical / Trunk Assessment: Normal  Communication   Communication: No difficulties  Cognition Arousal/Alertness: Awake/alert Behavior During Therapy: WFL for tasks assessed/performed Overall Cognitive Status:  Within Functional Limits for tasks assessed                      General Comments General comments (skin integrity, edema, etc.): Surgical site -clean, dry and intact. Hemovac found out prior to PT evaluation. Neurosurgeon notified upon entering room - not concerned. No leakage.    Exercises        Assessment/Plan    PT Assessment Patient needs continued PT services  PT Diagnosis Acute pain;Difficulty walking   PT Problem List Decreased strength;Pain;Decreased activity tolerance;Decreased balance;Decreased mobility;Decreased knowledge of precautions  PT Treatment Interventions Balance training;Gait training;Stair training;Patient/family education;Functional mobility training;Therapeutic activities;Therapeutic exercise   PT Goals (Current goals can be found in the Care Plan section) Acute Rehab PT Goals Patient Stated Goal: to make this pain better PT Goal Formulation: With patient Time For Goal Achievement: 08/14/14 Potential to Achieve Goals: Good    Frequency Min 5X/week   Barriers to discharge Inaccessible home environment Has steps to negotiate to enter home.    Co-evaluation               End of Session Equipment Utilized During Treatment: Gait belt Activity Tolerance: Patient limited by pain Patient left: in chair;with call bell/phone within reach Nurse Communication: Mobility status;Precautions         Time: 3343-5686 PT Time Calculation (min): 38 min   Charges:   PT Evaluation $Initial PT Evaluation Tier I: 1 Procedure PT Treatments $Gait Training: 8-22 mins $Self Care/Home Management: 8-22   PT G CodesCandy Sledge A 07/31/2014, 10:23 AM Candy Sledge, [place], DPT [phone]

## 2014-07-31 NOTE — Progress Notes (Signed)
Patient ID: Carlos Mccall, male   DOB: [birth year], 58 y.o.   MRN: [id number] C/o incisional pain, no weakness. Pt helping. Drain out. No weakness

## 2014-08-01 ENCOUNTER — Encounter (HOSPITAL_COMMUNITY): Payer: Self-pay | Admitting: General Practice

## 2014-08-01 NOTE — Progress Notes (Signed)
Physical Therapy Treatment Patient Details Name: Carlos Mccall MRN: [id number] DOB: 18-Aug-1956 Today's Date: 08/01/2014    History of Present Illness Patient is a 58 y/o male s/p L4-5 laminectomy and foraminotomy.     PT Comments    Patient making great progress with therapy. Answered questions re: home set up and precautions. Able to complete stair training. Patient safe to D/C from a mobility standpoint based on progression towards goals set on PT eval.    Follow Up Recommendations  Home health PT;Supervision/Assistance - 24 hour     Equipment Recommendations  None recommended by PT    Recommendations for Other Services       Precautions / Restrictions Precautions Precautions: Back Precaution Comments: Patient able to recall precautions.     Mobility  Bed Mobility Overal bed mobility: Modified Independent                Transfers Overall transfer level: Modified independent                  Ambulation/Gait Ambulation/Gait assistance: Supervision Ambulation Distance (Feet): 900 Feet Assistive device: Rolling walker (2 wheeled) Gait Pattern/deviations: Step-through pattern;Decreased stride length   Gait velocity interpretation: Below normal speed for age/gender     Stairs Stairs: Yes Stairs assistance: Supervision Stair Management: Alternating pattern;One rail Right Number of Stairs: 25 General stair comments: Patient with good safe technique  Wheelchair Mobility    Modified Rankin (Stroke Patients Only)       Balance                                    Cognition Arousal/Alertness: Awake/alert Behavior During Therapy: WFL for tasks assessed/performed Overall Cognitive Status: Within Functional Limits for tasks assessed                      Exercises      General Comments        Pertinent Vitals/Pain Pain Score: 3  Pain Location: back Pain Descriptors / Indicators: Sore Pain Intervention(s): Monitored  during session    Home Living                      Prior Function            PT Goals (current goals can now be found in the care plan section) Progress towards PT goals: Progressing toward goals    Frequency  Min 5X/week    PT Plan Current plan remains appropriate    Co-evaluation             End of Session   Activity Tolerance: Patient tolerated treatment well Patient left: with call bell/phone within reach;in bed     Time: 4970-2637 PT Time Calculation (min): 24 min  Charges:  $Gait Training: 23-37 mins                    G Codes:      Jacqualyn Posey 08/01/2014, 9:40 AM 08/01/2014 Jacqualyn Posey PTA [phone] pager [phone] office

## 2014-08-01 NOTE — Discharge Instructions (Signed)
Laminectomy - Laminotomy - Diskectomy, Care After Refer to this sheet in the next few weeks. These instructions provide you with information on caring for yourself after your procedure. Your health care provider may also give you more specific instructions. Your treatment has been planned according to current medical practices, but problems sometimes occur. Call your health care provider if you have any problems or questions after your procedure. WHAT TO EXPECT AFTER THE PROCEDURE After your procedure, it is typical to have the following sensations:  You may experience some pain around the incision area. HOME CARE INSTRUCTIONS  Check the incision made by the surgeon twice a day.  Change your bandages in about 2 days following surgery or as directed.  You may shower once the bandage is removed or as directed. Avoid bathtubs, swimming pools, and hot tubs for 3 weeks or until your incision has healed completely. If you have stitches or staples, they will be removed 2-3 weeks after surgery or as directed by your health care provider.  Follow your health care provider's instructions as to activities, exercises, and physical therapy.  Walking is permitted. You may use a treadmill without an incline. Cut down on activities if you have discomfort. You may also go up and down stairs as tolerated.  Do not lift anything heavier than 10-15 lbs. Avoid bending or twisting at the waist. Always bend your knees.  No driving is permitted for 2 weeks, or as directed by your doctor. You may be a passenger for 20-30 minute trips. Lying back in the passenger seat may be more comfortable for you.  Limit your sitting to 20-30 minute intervals. You should lie down or walk in between sitting periods. There are no limitations for sitting in a recliner chair.  Only take over-the-counter or prescription medicines for pain, discomfort, or fever as directed by your health care provider. SEEK MEDICAL CARE IF:   You have  increased bleeding from the wound.  You notice redness, swelling, or increasing pain in the wound.  Pus is coming from wound.  You have chills or a fever.  You notice a foul smell coming from the wound or dressing. SEEK IMMEDIATE MEDICAL CARE IF:   You develop a rash.  You develop any reaction or side effects to medicines given.  You develop bleeding or drainage from the wound following surgery.  You develop dizzy episodes or fainting while standing or develop shortness of breath or difficulty breathing.  You develop loss of bowel or bladder control.  You develop weakness or cannot use your legs. Document Released: 04/08/2005 Document Revised: 07/10/2013 Document Reviewed: 04/18/2013 [facility] Patient Information 2015 [place], [place]. This information is not intended to replace advice given to you by your health care provider. Make sure you discuss any questions you have with your health care provider. Laminectomy During a laminectomy, small pieces of bone in the spine called lamina are removed. The ligaments underneath the lamina and parts of the joints that have grown too large are also removed. This takes pressure off the nerves.  LET [facility] CARE PROVIDER KNOW ABOUT:  Any allergies you have.  All medicines you are taking, including vitamins, herbs, eye drops, creams, and over-the-counter medicines.  Previous problems you or members of your family have had with the use of anesthetics.  Any blood disorders you have.  Previous surgeries you have had.  Medical conditions you have. RISKS AND COMPLICATIONS  Generally, laminectomy is a safe procedure. However, as with any procedure, complications can occur.  Possible complications include:  Infection near the incision.  Nerve damage. Signs of this can be pain, weakness, or numbness.  Leaking of spinal fluid.  Blood clot in a leg. The clot can move to the lungs. This can be very serious.  Bowel or bladder incontinence  (rare). BEFORE THE PROCEDURE   You will need to stop taking certain medicines as directed by your health care provider.  If you smoke, stop at least 2 weeks before the procedure. Smoking can slow down the healing process and increase the risk of complications.  Do not eat or drink anything for at least 8 hours before the procedure. Take any medicines that your health care provider tells you to keep taking with a sip of water.  Do not drink alcohol the day before your surgery.  Tell your health care provider if you develop a cold or any infection before your surgery.  Arrange for someone to drive you home after the procedure or after your hospital stay. Also arrange for someone to help you with activities during recovery. PROCEDURE  Small monitors will be placed on your body. They are used to check your heart, blood pressure, and oxygen level.  An IV tube will be inserted into one of your veins. Medicine will flow directly into your body through the IV tube.  You might be given a sedative. This will help you relax.  You will be given a medicine to make you sleep (general anesthetic), and a breathing tube will be placed into your lungs. During general anesthesia, you are unaware of the procedure and do not feel any pain.  Your back will be cleaned with a special solution to kill germs on your skin.  Once you are asleep, the surgeon will make a 2-inch to 5-inch cut (incision) in your back. The length of the incision will depend on how many spinal bones (vertebrae) are being operated on.  Muscles in the back will be moved away from the vertebrae and pulled to the side.  Pieces of lamina will be removed.  The ligament that lies under the lamina and connects your vertebrae will be removed.  Enough ligaments and thickened joints will be removed to take pressure off your nerves.  Your nerves will be identified, and their passage will be tracked and assessed for excessive  tightness.  Your back muscles will be moved back into their normal position.  The area under your skin will be closed with small, absorbable stitches. These stitches do not need to be removed.  Your skin will be closed with small absorbable stitches or staples.  A dressing will be put over your incision.  The procedure may take 1-3 hours. AFTER THE PROCEDURE   You will stay in a recovery area until the anesthesia has worn off. Your blood pressure and pulse will be checked every so often. Then you will be taken to a hospital room.  You may continue to get fluids through the IV tube for a while.  Some pain is normal. You may be given pain medicine while still in the recovery area.  It is important to be up and moving as soon as possible after a surgery. Physical therapists will help you start walking.  To prevent blood clots in your legs:  You may be given special stockings to wear.  You may need to take medicine to prevent clots.  You may be asked to do special breathing exercises to re-expand your lungs. This is to prevent a lung  infection.  Most people stay in the hospital for 1-3 days after a laminectomy. Document Released: 09/07/2009 Document Revised: 07/10/2013 Document Reviewed: 05/01/2013 [facility] Patient Information 2015 [place], [place]. This information is not intended to replace advice given to you by your health care provider. Make sure you discuss any questions you have with your health care provider.

## 2014-08-01 NOTE — Progress Notes (Signed)
[place] arranged with [facility] as requested; Amy with [facility] called for arrangements; Aneta Mins [phone]

## 2014-08-01 NOTE — Discharge Summary (Signed)
Physician Discharge Summary  Patient ID: Carlos Mccall MRN: [id number] DOB/AGE: [birth year] 59 y.o.  Admit date: 07/30/2014 Discharge date: 08/01/2014  Admission Diagnoses:lumbar stenosis  Discharge Diagnoses:  Active Problems:   Lumbar stenosis   Discharged Condition: no weakness  Hospital Course: surgery  Consults: none  Significant Diagnostic Studies:mri  Treatments: lumbar decompression  Discharge Exam: Blood pressure 121/74, pulse 88, temperature 97.7 F (36.5 C), temperature source Oral, resp. rate 18, height 5' 7.5" (1.715 m), weight 75.382 kg (166 lb 3 oz), SpO2 96.00%. ambulating  Disposition: home       Medication List    ASK your doctor about these medications       ALPHA LIPOIC ACID PO  Take 600 mg by mouth 2 (two) times daily.     aspirin 81 MG tablet  Take 81 mg by mouth daily.     ASTRAGALUS PO  Take 470 mg by mouth.     b complex vitamins capsule  Take 1 capsule by mouth daily.     beta carotene 25000 UNIT capsule  Take 25,000 Units by mouth daily.     BILBERRY PO  Take 200 mg by mouth daily.     BIOFLAVONOIDS PO  Take 1 tablet by mouth daily.     BLACK CURRANT SEED OIL PO  Take 70 mg by mouth See admin instructions. Takes twice daily every three days (alternating with Lecithin and fish oil)     clonazePAM 2 MG tablet  Commonly known as:  KLONOPIN  Take 2-6 mg by mouth See admin instructions. 2 mg in the morning and 6 mg at bedtime     CoQ-10 200 MG Caps  Take 200 mg by mouth 2 (two) times daily.     eletriptan 40 MG tablet  Commonly known as:  RELPAX  Take 1 tablet (40 mg total) by mouth as needed for migraine (Do not exceed 2 per day or 4 per week).     EPIPEN 0.3 mg/0.3 mL Soaj injection  Generic drug:  EPINEPHrine  Inject 0.3 mg into the muscle as needed (for allergic reactions).     ESTER-C/BIOFLAVONOIDS PO  Take 1 tablet by mouth 2 (two) times daily.     fenofibrate 145 MG tablet  Commonly known as:  TRICOR  Take  145 mg by mouth daily before lunch.     FENUGREEK PO  Take 620 mg by mouth daily.     FEVERFEW PO  Take 38 mg by mouth 2 (two) times daily.     Fish Oil 1200 MG Caps  Take 1,200 mg by mouth See admin instructions. Takes twice daily every three days (alternating with Lecithin and black currant seeds)     Garlic 503 MG Caps  Take 600 mg by mouth daily.     Lecithin 1200 MG Caps  Take 1,200 capsules by mouth See admin instructions. Takes twice daily every three days (alternating with Black current seeds and fish oil)     LUTEIN 20 Caps  Take 20 mg by mouth daily.     Dandelion Root 520 MG Caps  Take 520 mg by mouth daily.     metoCLOPramide 10 MG tablet  Commonly known as:  REGLAN  Take 10 mg by mouth every 8 (eight) hours as needed for nausea.     MILK THISTLE PO  Take 600 mg by mouth daily.     multivitamin with minerals Tabs tablet  Take 1 tablet by mouth daily.     OLIVE LEAF  EXTRACT PO  Take 410 mg by mouth daily.     ONE-A-DAY BONE STRENGTH PO  Take 1 tablet by mouth daily.     OVER THE COUNTER MEDICATION  Take 1 tablet by mouth daily. Icelandic kelp     OVER THE COUNTER MEDICATION  Take 1 tablet by mouth daily. SolgarVM 75     OVER THE COUNTER MEDICATION  Take 0.2 mLs by mouth daily as needed (indigestion).     OVER THE COUNTER MEDICATION  Take 1 tablet by mouth daily. Zeaxanthin     OVER THE COUNTER MEDICATION  Take 1 tablet by mouth 2 (two) times daily.     OVER THE COUNTER MEDICATION  Take 1 capsule by mouth 2 (two) times daily with a meal. Ultra flora (probiotic)     OVER THE COUNTER MEDICATION  Take 1 capsule by mouth 2 (two) times daily. wholemega (fish oil supplement)     OVER THE COUNTER MEDICATION  Take 1 tablet by mouth daily. Super cal-mag     OVER THE COUNTER MEDICATION  Take 1 capsule by mouth daily. ACES + zinc     Pantothenic Acid 500 MG Tabs  Take 500 mg by mouth daily.     potassium gluconate 595 MG Tabs tablet  Take 595 mg by  mouth daily.     PREVIDENT 5000 PLUS DT  Place onto teeth once a week.     PYCNOGENOL PO  Take 60 mg by mouth every other day.     pyridOXINE 100 MG tablet  Commonly known as:  VITAMIN B-6  Take 100 mg by mouth daily.     riboflavin 100 MG Tabs tablet  Commonly known as:  VITAMIN B-2  Take 100 mg by mouth 4 (four) times daily.     rizatriptan 10 MG disintegrating tablet  Commonly known as:  MAXALT-MLT  Take 1 tablet (10 mg total) by mouth as needed for migraine. May repeat in 2 hours if needed     saw palmetto 160 MG capsule  Take 160 mg by mouth 2 (two) times daily.     sertraline 25 MG tablet  Commonly known as:  ZOLOFT  Take 50 mg by mouth at bedtime.     THERMOTABS PO  Take 1 tablet by mouth daily as needed (uses when he exercise to maintain electrolytes).     TURMERIC PO  Take 1 tablet by mouth daily.     venlafaxine XR 150 MG 24 hr capsule  Commonly known as:  EFFEXOR-XR  Take 150 mg by mouth daily.     Vitamin D 2000 UNITS tablet  Take 2,000 Units by mouth 2 (two) times daily.         Signed: Floyce Stakes 08/01/2014, 8:57 AM

## 2014-08-01 NOTE — Progress Notes (Signed)
Pt d/ to home by car with family. Assessment stable. Prescriptions given. Pt verbalizes understanding of d/c instructions.

## 2014-08-19 ENCOUNTER — Ambulatory Visit: Payer: Medicare Other | Admitting: Diagnostic Neuroimaging

## 2014-09-09 ENCOUNTER — Ambulatory Visit (INDEPENDENT_AMBULATORY_CARE_PROVIDER_SITE_OTHER): Payer: Medicare Other | Admitting: Diagnostic Neuroimaging

## 2014-09-09 ENCOUNTER — Encounter: Payer: Self-pay | Admitting: Diagnostic Neuroimaging

## 2014-09-09 VITALS — BP 121/78 | HR 95 | Temp 97.5°F | Ht 67.5 in | Wt 166.2 lb

## 2014-09-09 DIAGNOSIS — G43109 Migraine with aura, not intractable, without status migrainosus: Secondary | ICD-10-CM
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
## 2014-11-14 ENCOUNTER — Other Ambulatory Visit: Payer: Self-pay | Admitting: Diagnostic Neuroimaging

## 2015-03-11 ENCOUNTER — Emergency Department (HOSPITAL_COMMUNITY)
Admission: EM | Admit: 2015-03-11 | Discharge: 2015-03-11 | Disposition: A | Discharge status: Home / Self Care | Payer: PPO | Attending: Emergency Medicine | Admitting: Emergency Medicine

## 2015-03-11 ENCOUNTER — Encounter (HOSPITAL_COMMUNITY): Payer: Self-pay | Admitting: Emergency Medicine

## 2015-03-11 ENCOUNTER — Emergency Department (HOSPITAL_COMMUNITY): Payer: PPO

## 2015-03-11 DIAGNOSIS — G8929 Other chronic pain: Secondary | ICD-10-CM | POA: Diagnosis not present

## 2015-03-11 DIAGNOSIS — K589 Irritable bowel syndrome without diarrhea: Secondary | ICD-10-CM | POA: Insufficient documentation

## 2015-03-11 DIAGNOSIS — Z87828 Personal history of other (healed) physical injury and trauma: Secondary | ICD-10-CM | POA: Insufficient documentation

## 2015-03-11 DIAGNOSIS — E785 Hyperlipidemia, unspecified: Secondary | ICD-10-CM | POA: Diagnosis not present

## 2015-03-11 DIAGNOSIS — M797 Fibromyalgia: Secondary | ICD-10-CM | POA: Insufficient documentation

## 2015-03-11 DIAGNOSIS — R112 Nausea with vomiting, unspecified: Secondary | ICD-10-CM | POA: Diagnosis not present

## 2015-03-11 DIAGNOSIS — F419 Anxiety disorder, unspecified: Secondary | ICD-10-CM | POA: Diagnosis not present

## 2015-03-11 DIAGNOSIS — G43909 Migraine, unspecified, not intractable, without status migrainosus: Secondary | ICD-10-CM | POA: Diagnosis not present

## 2015-03-11 DIAGNOSIS — H539 Unspecified visual disturbance: Secondary | ICD-10-CM | POA: Insufficient documentation

## 2015-03-11 DIAGNOSIS — F329 Major depressive disorder, single episode, unspecified: Secondary | ICD-10-CM | POA: Insufficient documentation

## 2015-03-11 DIAGNOSIS — M542 Cervicalgia: Secondary | ICD-10-CM | POA: Insufficient documentation

## 2015-03-11 DIAGNOSIS — Z91040 Latex allergy status: Secondary | ICD-10-CM | POA: Insufficient documentation

## 2015-03-11 DIAGNOSIS — R51 Headache: Secondary | ICD-10-CM

## 2015-03-11 DIAGNOSIS — H53149 Visual discomfort, unspecified: Secondary | ICD-10-CM | POA: Diagnosis not present

## 2015-03-11 DIAGNOSIS — Z79899 Other long term (current) drug therapy: Secondary | ICD-10-CM | POA: Insufficient documentation

## 2015-03-11 DIAGNOSIS — H409 Unspecified glaucoma: Secondary | ICD-10-CM | POA: Insufficient documentation

## 2015-03-11 DIAGNOSIS — R109 Unspecified abdominal pain: Secondary | ICD-10-CM | POA: Insufficient documentation

## 2015-03-11 DIAGNOSIS — R519 Headache, unspecified: Secondary | ICD-10-CM

## 2015-03-11 DIAGNOSIS — F4310 Post-traumatic stress disorder, unspecified: Secondary | ICD-10-CM | POA: Diagnosis not present

## 2015-03-11 LAB — COMPREHENSIVE METABOLIC PANEL
ALBUMIN: 4.2 g/dL (ref 3.5–5.0)
ALT: 31 U/L (ref 17–63)
AST: 27 U/L (ref 15–41)
Alkaline Phosphatase: 70 U/L (ref 38–126)
Anion gap: 12 (ref 5–15)
BUN: 10 mg/dL (ref 6–20)
CO2: 21 mmol/L — AB (ref 22–32)
Calcium: 9.2 mg/dL (ref 8.9–10.3)
Chloride: 101 mmol/L (ref 101–111)
Creatinine, Ser: 1.19 mg/dL (ref 0.61–1.24)
GFR calc non Af Amer: >60 mL/min (ref >60)
GLUCOSE: 115 mg/dL — AB (ref 65–99)
POTASSIUM: 3.2 mmol/L — AB (ref 3.5–5.1)
SODIUM: 134 mmol/L — AB (ref 135–145)
Total Bilirubin: 1.1 mg/dL (ref 0.3–1.2)
Total Protein: 6.4 g/dL — ABNORMAL LOW (ref 6.5–8.1)

## 2015-03-11 LAB — CBC WITH DIFFERENTIAL/PLATELET
BASOS PCT: 0 % (ref 0–1)
BLASTS: 0 %
Band Neutrophils: 0 % (ref 0–10)
Basophils Absolute: 0 10*3/uL (ref 0.0–0.1)
Eosinophils Absolute: 0 10*3/uL (ref 0.0–0.7)
Eosinophils Relative: 0 % (ref 0–5)
HCT: 42.3 % (ref 39.0–52.0)
Hemoglobin: 15.7 g/dL (ref 13.0–17.0)
LYMPHS PCT: 13 % (ref 12–46)
Lymphs Abs: 1.7 10*3/uL (ref 0.7–4.0)
MCH: 32 pg (ref 26.0–34.0)
MCHC: 37.1 g/dL — AB (ref 30.0–36.0)
MCV: 86.3 fL (ref 78.0–100.0)
Metamyelocytes Relative: 0 %
Monocytes Absolute: 1.3 10*3/uL — ABNORMAL HIGH (ref 0.1–1.0)
Monocytes Relative: 10 % (ref 3–12)
Myelocytes: 0 %
NEUTROS ABS: 9.8 10*3/uL — AB (ref 1.7–7.7)
NEUTROS PCT: 77 % (ref 43–77)
OTHER: 0 %
PROMYELOCYTES ABS: 0 %
Platelets: 155 10*3/uL (ref 150–400)
RBC: 4.9 MIL/uL (ref 4.22–5.81)
RDW: 13.3 % (ref 11.5–15.5)
WBC: 12.8 10*3/uL — ABNORMAL HIGH (ref 4.0–10.5)
nRBC: 0 /100 WBC

## 2015-03-11 LAB — I-STAT TROPONIN, ED: Troponin i, poc: 0.03 ng/mL (ref 0.00–0.08)

## 2015-03-11 LAB — TROPONIN I: Troponin I: <0.03 ng/mL (ref <0.031)

## 2015-03-11 MED ORDER — SODIUM CHLORIDE 0.9 % IV BOLUS (SEPSIS)
1000.0000 mL | Freq: Once | INTRAVENOUS | Status: AC
  Administered 2015-03-11: 1000 mL via INTRAVENOUS

## 2015-03-11 MED ORDER — MAGNESIUM SULFATE 2 GM/50ML IV SOLN
2.0000 g | Freq: Once | INTRAVENOUS | Status: AC
  Administered 2015-03-11: 2 g via INTRAVENOUS
  Filled 2015-03-11: qty 50

## 2015-03-11 MED ORDER — VALPROATE SODIUM 500 MG/5ML IV SOLN: 1000.0000 mg | Freq: Once | INTRAVENOUS | Status: DC

## 2015-03-11 MED ORDER — POTASSIUM CHLORIDE CRYS ER 20 MEQ PO TBCR
40.0000 meq | EXTENDED_RELEASE_TABLET | Freq: Once | ORAL | Status: AC
  Administered 2015-03-11: 40 meq via ORAL
  Filled 2015-03-11: qty 2

## 2015-03-11 MED ORDER — VALPROATE SODIUM 500 MG/5ML IV SOLN
1000.0000 mg | Freq: Once | INTRAVENOUS | Status: DC
  Filled 2015-03-11: qty 10

## 2015-03-11 MED ORDER — VALPROATE SODIUM 500 MG/5ML IV SOLN
1000.0000 mg | Freq: Once | INTRAVENOUS | Status: AC
  Administered 2015-03-11: 1000 mg via INTRAVENOUS
  Filled 2015-03-11: qty 10

## 2015-03-11 NOTE — ED Provider Notes (Signed)
CSN: [id number]     Arrival date & time 03/11/15  0946 History   First MD Initiated Contact with Patient 03/11/15 1010     Chief Complaint  Patient presents with  . Headache     (Consider location/radiation/quality/duration/timing/severity/associated sxs/prior Treatment) The history is provided by the patient. No language interpreter was used.  Carlos Mccall is a 59 y/o M with PMHx of head trauma, migraines, depression, HLD, PTSD, spinal stenosis, IBS, tremors, glaucoma, chronic pain, fibromyalgia presenting to the ED with headache that started yesterday afternoon at approximately 12:00 PM. Reported that the headache is typical migraine that he presents with-reported that it is a constant throbbing headache with sensitivity toward slight, nausea, vomiting, tingling to the arms and legs. Stated he starts to feel off balanced Wife who accompanies patient, reported that patient is been dealing with headaches for approximately 20 years since 1997. Reported that medications were discontinued and then restarted a couple weeks ago. Reported the patient has headaches frequently, migraines, and is currently taking Effexor and Zoloft for combination therapy. Reported that approximately 3-4 weeks ago Eletriptan and baclofen were started. Wife reported that patient had headache discomfort grossly worse yesterday. Stated that he normally has an aura where he feels warm in his legs and arms, tastes metal and has flickering of lights in his field of vision. Wife reported that patient was very confused yesterday, having difficulty forming words was able to follow commands well. Patient reported that he's been dealing with this for a long time and is currently seen by Dr. Orie Rout for his headaches and for headache control. Reported that he's been feeling nauseous and having nonstop vomiting-reported that he has vomited at least "30 times." Reported that patient tried taking Eletriptan and Reglan earlier this  morning - 1:00AM and stated that he was unable to keep it down and threw it back up. Reported that earlier today he started to experience chest pain localized to the left side of the chest described as a palpitation sensation. Denied fever, neck stiffness, diarrhea, melena, hematochezia, loss of sensation, decreased urination or changes to urine, fainting, shortness of breath, difficulty breathing, fall, head injury. PCP Dr. Marisue Humble Neuro Dr. Domingo Cocking  Past Medical History  Diagnosis Date  . Head trauma     history of multiple  . Migraines     complex  . Depression   . Allergy   . Hyperlipidemia   . Post traumatic stress disorder (PTSD)   . Migraines   . Spinal stenosis   . IBS (irritable bowel syndrome)   . Tremors of nervous system   . Complication of anesthesia     difficult time waking up, cannot tolerate getting nerve block- pain related  . H/O echocardiogram     also had a tilt table test-showed orthostatic hypotension   . Orthostatic hypotension   . Shortness of breath   . GERD (gastroesophageal reflux disease)     uses peppermint on occasion   . Chronic pain   . Neurocardiogenic syncope   . Glaucoma     pre  . Fibromyalgia     CHRONIC FATIGUE SYNDROME  . Anxiety     PANIC EPISODES  . Anxiety   . Arthritis     lumbar spine stenosis    Past Surgical History  Procedure Laterality Date  . Lumbar laminectomy    . Trigger thumb release    . Tonsillectomy    . Closed reduction shoulder dislocation    . Thumb arthroscopy  both  . Ear cyst excision  08/09/2012    WRIST-R, NOT EARProcedure: CYST REMOVAL;  Surgeon: Cammie Sickle., MD;  Location: [place];  Service: Orthopedics;  Laterality: Right;  EXCISIONAL BIOPSY  . Orif radial fracture  08/09/2012    Procedure: OPEN REDUCTION INTERNAL FIXATION (ORIF) RADIAL FRACTURE;  Surgeon: Cammie Sickle., MD;  Location: [place];  Service: Orthopedics;  Laterality: Right;  . Back  surgery  1996,07/30/2014  . Hand nerve repair  1983  . Lumbar laminectomy/decompression microdiscectomy Bilateral 07/30/2014    Procedure: Bilateral Lumbar four-five Laminectomy/Foraminotomy;  Surgeon: Floyce Stakes, MD;  Location: [facility];  Service: Neurosurgery;  Laterality: Bilateral;  Bilateral Lumbar four-five Laminectomy/Foraminotomy   Family History  Problem Relation Age of Onset  . Adopted: Yes   History  Substance Use Topics  . Smoking status: Never Smoker   . Smokeless tobacco: Never Used  . Alcohol Use: No    Review of Systems  Constitutional: Negative for fever and chills.  Eyes: Positive for photophobia and visual disturbance.  Respiratory: Negative for chest tightness and shortness of breath.   Cardiovascular: Negative for chest pain.  Gastrointestinal: Positive for nausea, vomiting and abdominal pain. Negative for diarrhea, constipation, blood in stool and anal bleeding.  Genitourinary: Negative for dysuria and decreased urine volume.  Musculoskeletal: Positive for neck pain. Negative for back pain.  Neurological: Positive for headaches.      Allergies  Antihistamines, chlorpheniramine-type; Codeine; Latex; Quinine derivatives; Shellfish allergy; Tetracyclines & related; and Thimerosal  Home Medications   Prior to Admission medications   Medication Sig Start Date End Date Taking? Authorizing Provider  ALPHA-LIPOIC ACID PO Take 1 tablet by mouth daily.    Yes Historical Provider, MD  b complex vitamins capsule Take 1 capsule by mouth daily.   Yes Historical Provider, MD  baclofen (LIORESAL) 10 MG tablet TAKE 1 TABLET FOR HEADACHE, UP TO TWICE A DAY. AVOID DAILY USE, LIMIIT USE TO 1-2 DAYS PER WEEK 02/10/15  Yes Historical Provider, MD  Bilberry, Vaccinium myrtillus, (BILBERRY PO) Take 200 mg by mouth daily.   Yes Historical Provider, MD  clonazePAM (KLONOPIN) 2 MG tablet Take 2-6 mg by mouth See admin instructions. 2 mg in the morning and 6 mg at bedtime    Yes Historical Provider, MD  Coenzyme Q10 (COQ-10) 200 MG CAPS Take 200 mg by mouth 2 (two) times daily.   Yes Historical Provider, MD  EPINEPHrine (EPIPEN) 0.3 mg/0.3 mL SOAJ Inject 0.3 mg into the muscle as needed (for allergic reactions).   Yes Historical Provider, MD  fenofibrate 160 MG tablet Take 160 mg by mouth daily.   Yes Historical Provider, MD  Lecithin 1200 MG CAPS Take 1,200 capsules by mouth daily.    Yes Historical Provider, MD  Misc Natural Products (LUTEIN 20) CAPS Take 20 mg by mouth daily.   Yes Historical Provider, MD  Multiple Vitamin (MULTIVITAMIN WITH MINERALS) TABS Take 1 tablet by mouth daily.   Yes Historical Provider, MD  Omega-3 Fatty Acids (FISH OIL) 1200 MG CAPS Take 1,200 mg by mouth daily.    Yes Historical Provider, MD  Oral Electrolytes (THERMOTABS PO) Take 1 tablet by mouth daily as needed (uses when he exercise to maintain electrolytes).   Yes Historical Provider, MD  OVER THE COUNTER MEDICATION Take 1 tablet by mouth daily. Super cal-mag   Yes Historical Provider, MD  OVER THE COUNTER MEDICATION Take 1 tablet by mouth daily. UltraFlora Spectrum  Yes Historical Provider, MD  Pantothenic Acid 500 MG TABS Take 500 mg by mouth daily.   Yes Historical Provider, MD  potassium gluconate 595 MG TABS tablet Take 595 mg by mouth daily.   Yes Historical Provider, MD  rizatriptan (MAXALT-MLT) 10 MG disintegrating tablet TAKE 1 TABLET (10 MG TOTAL) BY MOUTH AS NEEDED FOR MIGRAINE. MAY REPEAT IN 2 HOURS IF NEEDED 11/14/14  Yes Penni Bombard, MD  sertraline (ZOLOFT) 25 MG tablet Take 50 mg by mouth at bedtime.  05/31/13  Yes Historical Provider, MD  Sodium Fluoride (PREVIDENT 5000 PLUS DT) Place onto teeth once a week.   Yes Historical Provider, MD  venlafaxine (EFFEXOR-XR) 150 MG 24 hr capsule Take 150 mg by mouth daily.     Yes Historical Provider, MD  zonisamide (ZONEGRAN) 25 MG capsule TAKE 1 CAPSULE AT BEDTIME FOR 2 WEEKS THEN INCREASE TO 2 CAPSULES AT BEDTIME 02/10/15   Yes Historical Provider, MD  eletriptan (RELPAX) 40 MG tablet Take 1 tablet (40 mg total) by mouth as needed for migraine (Do not exceed 2 per day or 4 per week). Patient not taking: Reported on 03/11/2015 05/24/13   Penni Bombard, MD   BP 146/80 mmHg  Pulse 76  Temp(Src) 98.4 F (36.9 C) (Oral)  Resp 19  Ht 5' 7.5" (1.715 m)  Wt 165 lb (74.844 kg)  BMI 25.45 kg/m2  SpO2 100% Physical Exam  Constitutional: He is oriented to person, place, and time. He appears well-developed and well-nourished. No distress.  HENT:  Head: Normocephalic and atraumatic.  Mouth/Throat: Oropharynx is clear and moist. No oropharyngeal exudate.  Eyes: Conjunctivae and EOM are normal. Pupils are equal, round, and reactive to light. Right eye exhibits no discharge. Left eye exhibits no discharge.  Negative nystagmus  Neck: Normal range of motion. Neck supple. No tracheal deviation present.  Cardiovascular: Normal rate, regular rhythm and normal heart sounds.  Exam reveals no friction rub.   No murmur heard. Pulses:      Radial pulses are 2+ on the right side, and 2+ on the left side.       Dorsalis pedis pulses are 2+ on the right side, and 2+ on the left side.  Cap refill < 3 seconds Negative swelling or pitting edema noted to the lower extremities bilaterally   Pulmonary/Chest: Effort normal and breath sounds normal. No respiratory distress. He has no wheezes. He has no rales.  Abdominal: Soft. Bowel sounds are normal. He exhibits no distension. There is tenderness. There is no rebound and no guarding.  Generalized tenderness upon palpation to the abdomen - negative signs of acute abdomen  Musculoskeletal: Normal range of motion.  Full ROM to upper and lower extremities without difficulty noted, negative ataxia noted.  Lymphadenopathy:    He has no cervical adenopathy.  Neurological: He is alert and oriented to person, place, and time. No cranial nerve deficit. He exhibits normal muscle tone.  Coordination normal.  Cranial nerves III-XII grossly intact Strength 5+/5+ to upper and lower extremities bilaterally with resistance applied, equal distribution noted Equal grip strength bilaterally Patient is able to bring finger to nose bilaterally without difficulty or ataxia Negative facial droop Negative slurred speech Patient has difficulty forming words Negative arm drift Negative leg drift  Fine motor skills intact Patient is able to follow commands well  Patient responds to questions properly  Skin: Skin is warm and dry. No rash noted. He is not diaphoretic. No erythema.  Psychiatric: He has a normal mood  and affect. His behavior is normal. Thought content normal.  Nursing note and vitals reviewed.   ED Course  Procedures (including critical care time)  Results for orders placed or performed during the hospital encounter of 03/11/15  CBC with Differential/Platelet  Result Value Ref Range   WBC 12.8 (H) 4.0 - 10.5 K/uL   RBC 4.90 4.22 - 5.81 MIL/uL   Hemoglobin 15.7 13.0 - 17.0 g/dL   HCT 42.3 39.0 - 52.0 %   MCV 86.3 78.0 - 100.0 fL   MCH 32.0 26.0 - 34.0 pg   MCHC 37.1 (H) 30.0 - 36.0 g/dL   RDW 13.3 11.5 - 15.5 %   Platelets 155 150 - 400 K/uL   Neutrophils Relative % 77 43 - 77 %   Lymphocytes Relative 13 12 - 46 %   Monocytes Relative 10 3 - 12 %   Eosinophils Relative 0 0 - 5 %   Basophils Relative 0 0 - 1 %   Band Neutrophils 0 0 - 10 %   Metamyelocytes Relative 0 %   Myelocytes 0 %   Promyelocytes Absolute 0 %   Blasts 0 %   nRBC 0 0 /100 WBC   Other 0 %   Neutro Abs 9.8 (H) 1.7 - 7.7 K/uL   Lymphs Abs 1.7 0.7 - 4.0 K/uL   Monocytes Absolute 1.3 (H) 0.1 - 1.0 K/uL   Eosinophils Absolute 0.0 0.0 - 0.7 K/uL   Basophils Absolute 0.0 0.0 - 0.1 K/uL   Smear Review MORPHOLOGY UNREMARKABLE   Comprehensive metabolic panel  Result Value Ref Range   Sodium 134 (L) 135 - 145 mmol/L   Potassium 3.2 (L) 3.5 - 5.1 mmol/L   Chloride 101 101 - 111 mmol/L   CO2  21 (L) 22 - 32 mmol/L   Glucose, Bld 115 (H) 65 - 99 mg/dL   BUN 10 6 - 20 mg/dL   Creatinine, Ser 1.19 0.61 - 1.24 mg/dL   Calcium 9.2 8.9 - 10.3 mg/dL   Total Protein 6.4 (L) 6.5 - 8.1 g/dL   Albumin 4.2 3.5 - 5.0 g/dL   AST 27 15 - 41 U/L   ALT 31 17 - 63 U/L   Alkaline Phosphatase 70 38 - 126 U/L   Total Bilirubin 1.1 0.3 - 1.2 mg/dL   GFR calc non Af Amer >60 >60 mL/min   GFR calc Af Amer >60 >60 mL/min   Anion gap 12 5 - 15  Troponin I  Result Value Ref Range   Troponin I <0.03 <0.031 ng/mL  I-stat troponin, ED  Result Value Ref Range   Troponin i, poc 0.03 0.00 - 0.08 ng/mL   Comment 3            Labs Review Labs Reviewed  CBC WITH DIFFERENTIAL/PLATELET - Abnormal; Notable for the following:    WBC 12.8 (*)    MCHC 37.1 (*)    Neutro Abs 9.8 (*)    Monocytes Absolute 1.3 (*)    All other components within normal limits  COMPREHENSIVE METABOLIC PANEL - Abnormal; Notable for the following:    Sodium 134 (*)    Potassium 3.2 (*)    CO2 21 (*)    Glucose, Bld 115 (*)    Total Protein 6.4 (*)    All other components within normal limits  TROPONIN I  I-STAT TROPOININ, ED    Imaging Review Ct Head Wo Contrast  03/11/2015   CLINICAL DATA:  Chronic migraine.  EXAM: CT  HEAD WITHOUT CONTRAST  TECHNIQUE: Contiguous axial images were obtained from the base of the skull through the vertex without intravenous contrast.  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 Scattered periventricular hypodensities compatible microvascular ischemic disease. No CT evidence of acute large territory infarct. No intraparenchymal or extra-axial mass or hemorrhage. Normal size and configuration of the ventricles and basilar cisterns. No midline shift.  Limited visualization the paranasal sinuses and mastoid air cells is normal. No air-fluid levels. Regional soft tissues appear normal. No displaced calvarial  fracture.  IMPRESSION: Similar findings of mildly advanced microvascular ischemic disease without acute intracranial process.   Electronically Signed   By: Sandi Mariscal M.D.   On: 03/11/2015 11:53     EKG Interpretation   Date/Time:  Wednesday March 11 2015 11:24:13 EDT Ventricular Rate:  95 PR Interval:  170 QRS Duration: 85 QT Interval:  343 QTC Calculation: 431 R Axis:   62 Text Interpretation:  Sinus rhythm since last tracing no significant  change Confirmed by Eulis Foster  MD, ELLIOTT ([id number]) on 03/11/2015 11:41:48 AM      11:07 AM This provider had a long discussion with dr. Crosby Oyster - instructed to order Magnesium 2 g for this will help with headaches.   11:24 AM Tried to call office of Dr. Julianne Handler - Dr. Julianne Handler not in today - no other physician present to speak with.   12: 85 PM Dr. Eulis Foster at bedside assessing patient. Magnesium did not help much with the headache. Attending spoke with patient and wife. Reported that if not better in 30 minutes - 1 gram of Valproate to be administered via IV.    1:51 PM Patient reported that his headache is still present, rated the headache 7-8/10. Will administer Depakote IV - Dr. Eulis Foster to place the order.   4:03 PM Re-assessed the patient. Reported that his headache is still a 9/10.   4:21 PM Dr. Crosby Oyster at bedside assessing patient.   4:29 PM Dr. Crosby Oyster spoke with patient. Reported that patient can be discharged home and that patient can follow up as an outpatient with his PCP and Dr. Domingo Cocking. Reported that wife and patient is in accordance to plan of care. Attending physician instructed this provider to be discharged home, cleared by attending.  Upon being wheeled out, patient was in no acute distress. Patient laughing with nurse. Patient pleasant and interactive.   MDM   Final diagnoses:  Nonintractable headache, unspecified chronicity pattern, unspecified headache type    Medications  magnesium sulfate IVPB 2 g 50 mL (0 g  Intravenous Stopped 03/11/15 1249)  sodium chloride 0.9 % bolus 1,000 mL (0 mLs Intravenous Stopped 03/11/15 1305)  sodium chloride 0.9 % bolus 1,000 mL (0 mLs Intravenous Stopped 03/11/15 1659)  valproate (DEPACON) 1,000 mg in dextrose 5 % 50 mL IVPB (0 mg Intravenous Stopped 03/11/15 1510)  potassium chloride SA (K-DUR,KLOR-CON) CR tablet 40 mEq (40 mEq Oral Given 03/11/15 1713)    Filed Vitals:   03/11/15 1130 03/11/15 1356 03/11/15 1537 03/11/15 1545  BP: 136/77 129/68 132/78 146/80  Pulse: 97 83 79 76  Temp:  98.4 F (36.9 C)    TempSrc:  Oral    Resp: 21 12 14 19   Height:      Weight:      SpO2: 98% 99% 100% 100%   EKG noted sinus rhythm with heart rate of 95 bpm with no cervical  change since last tracing. Troponin negative elevation. CBC noted mildly elevated white blood cell count 12.8. Hemoglobin 15.7, hematocrit 42.3. CMP noted sodium of 134. Potassium mildly low of 3.2. Mildly low chloride of 21. Glucose 1:15 with negative elevated anion gap. CT head without contrast noted similar findings of mildly advanced microvascular ischemic disease without acute intracranial process. Doubt stroke. Doubt TIA. Doubt SAH/ICH. Doubt meningitis. Appears to be a chronic headache that patient has had in the past. Patient has been monitored in the ED setting with negative changes to mentation - patient follows commands well patient responds to question appropriately. Negative focal neurological deficits noted. Negative findings of encephalopathy. Doubt metabolic instability. Negative episodes of emesis in the ED setting. Patient given fluids and medications in the ED setting with some relief. Patient seen and assessed by attending physician, Dr. Crosby Oyster, cleared patient for discharge. Patient given potassium in the ED setting - recommended potassium to be re-checked by PCP. Discussed with patient to follow up with Headache physician as an outpatient. Patient stable, afebrile. Patient not septic appearing.  Negative signs of respiratory distress. Discharged patient. Discussed with patient to closely monitor symptoms and if symptoms are to worsen or change to report back to the ED - strict return instructions given.  Patient agreed to plan of care, understood, all questions answered.   Jamse Mead, PA-C 03/11/15 [place], MD 03/16/15 1946

## 2015-03-11 NOTE — ED Notes (Signed)
Pt arrived to ED with c/o headache. Pt has hx of chronic migraines but has not had one like this in a long time. Pt wife at bed side stated that yesterday afternoon pt was unable to get words out properly. Stated that he is going to a new doctor for his migraines and that they have changed some of his medications around. Pt started vomiting yesterday evening and is sensitive to light. Denies any changes in vision.

## 2015-03-11 NOTE — Discharge Instructions (Signed)
Please call your doctor for a followup appointment within 24-48 hours. When you talk to your doctor please let them know that you were seen in the emergency department and have them acquire all of your records so that they can discuss the findings with you and formulate a treatment plan to fully care for your new and ongoing problems. Please call and set-up an appointment with your primary care provider  Please call and set-up an appointment with [facility] - please follow up with Dr. Orie Rout  Please have potassium re-checked within 48 hours Please rest and stay hydrated Please continue to monitor symptoms closely and if symptoms are to worsen or change (fever greater than 101, chills, sweating, nausea, vomiting, chest pain, shortness of breathe, difficulty breathing, weakness, numbness, tingling, worsening or changes to pain pattern, sudden loss of vision, weakness to one side of the body, neck pain, neck stiffness, inability keep food fluids down, worsening or changes to stomach pain, worsening or changes to headache) please report back to the Emergency Department immediately.   Migraine Headache A migraine headache is an intense, throbbing pain on one or both sides of your head. A migraine can last for 30 minutes to several hours. CAUSES  The exact cause of a migraine headache is not always known. However, a migraine may be caused when nerves in the brain become irritated and release chemicals that cause inflammation. This causes pain. Certain things may also trigger migraines, such as:  Alcohol.  Smoking.  Stress.  Menstruation.  Aged cheeses.  Foods or drinks that contain nitrates, glutamate, aspartame, or tyramine.  Lack of sleep.  Chocolate.  Caffeine.  Hunger.  Physical exertion.  Fatigue.  Medicines used to treat chest pain (nitroglycerine), birth control pills, estrogen, and some blood pressure medicines. SIGNS AND SYMPTOMS  Pain on one or both  sides of your head.  Pulsating or throbbing pain.  Severe pain that prevents daily activities.  Pain that is aggravated by any physical activity.  Nausea, vomiting, or both.  Dizziness.  Pain with exposure to bright lights, loud noises, or activity.  General sensitivity to bright lights, loud noises, or smells. Before you get a migraine, you may get warning signs that a migraine is coming (aura). An aura may include:  Seeing flashing lights.  Seeing bright spots, halos, or zigzag lines.  Having tunnel vision or blurred vision.  Having feelings of numbness or tingling.  Having trouble talking.  Having muscle weakness. DIAGNOSIS  A migraine headache is often diagnosed based on:  Symptoms.  Physical exam.  A CT scan or MRI of your head. These imaging tests cannot diagnose migraines, but they can help rule out other causes of headaches. TREATMENT Medicines may be given for pain and nausea. Medicines can also be given to help prevent recurrent migraines.  HOME CARE INSTRUCTIONS  Only take over-the-counter or prescription medicines for pain or discomfort as directed by your health care provider. The use of long-term narcotics is not recommended.  Lie down in a dark, quiet room when you have a migraine.  Keep a journal to find out what may trigger your migraine headaches. For example, write down:  What you eat and drink.  How much sleep you get.  Any change to your diet or medicines.  Limit alcohol consumption.  Quit smoking if you smoke.  Get 7-9 hours of sleep, or as recommended by your health care provider.  Limit stress.  Keep lights dim if bright lights bother you and  make your migraines worse. SEEK IMMEDIATE MEDICAL CARE IF:   Your migraine becomes severe.  You have a fever.  You have a stiff neck.  You have vision loss.  You have muscular weakness or loss of muscle control.  You start losing your balance or have trouble walking.  You feel  faint or pass out.  You have severe symptoms that are different from your first symptoms. MAKE SURE YOU:   Understand these instructions.  Will watch your condition.  Will get help right away if you are not doing well or get worse. Document Released: 09/19/2005 Document Revised: 02/03/2014 Document Reviewed: 05/27/2013 [facility] Patient Information 2015 [place], [place]. This information is not intended to replace advice given to you by your health care provider. Make sure you discuss any questions you have with your health care provider.

## 2015-03-11 NOTE — ED Notes (Signed)
Pt family member seemed upset while this RN was assisting pt out via wheelchair. She states no one gave him any pain medicine and his h/a is worse than what it was when he came in. This RN explained that his h/a are chronic and we don't give narcotics for chronic pain. Pt was laughing and joking on the way out and states he is no longer having n/v so he believes that's a good thing.

## 2015-03-11 NOTE — ED Provider Notes (Signed)
  Face-to-face evaluation   History: Headache for 2 days, associated with difficulty talking, especially on the telephone. He is taking his usual medications for headache at home. He is being actively seen by his neurologist.  Physical exam: Alert, anxious, cooperative and interactive. There is no dysarthria. He is distracted, and does not answer all questions put to him. No evidence for expressive or receptive aphasia.  Medications  potassium chloride SA (K-DUR,KLOR-CON) CR tablet 40 mEq (not administered)  magnesium sulfate IVPB 2 g 50 mL (0 g Intravenous Stopped 03/11/15 1249)  sodium chloride 0.9 % bolus 1,000 mL (0 mLs Intravenous Stopped 03/11/15 1305)  sodium chloride 0.9 % bolus 1,000 mL (1,000 mLs Intravenous New Bag/Given 03/11/15 1305)  valproate (DEPACON) 1,000 mg in dextrose 5 % 50 mL IVPB (1,000 mg Intravenous New Bag/Given 03/11/15 1455)    Patient Vitals for the past 24 hrs:  BP Temp Temp src Pulse Resp SpO2 Height Weight  03/11/15 1545 146/80 mmHg - - 76 19 100 % - -  03/11/15 1537 132/78 mmHg - - 79 14 100 % - -  03/11/15 1356 129/68 mmHg 98.4 F (36.9 C) Oral 83 12 99 % - -  03/11/15 1130 136/77 mmHg - - 97 21 98 % - -  03/11/15 1115 136/79 mmHg - - 96 21 94 % - -  03/11/15 1100 141/77 mmHg - - 94 19 96 % - -  03/11/15 1045 153/84 mmHg - - 93 16 96 % - -  03/11/15 1030 138/80 mmHg - - 95 19 98 % - -  03/11/15 1002 144/87 mmHg 98.2 F (36.8 C) Oral 101 16 98 % 5' 7.5" (1.715 m) 165 lb (74.844 kg)    4:30 PM Reevaluation with update and discussion. After initial assessment and treatment, an updated evaluation reveals he is wife thinks he is better, even though he has persistent generalized achiness, and headache. At this time. He is alert, conversant, smiling, flexing his neck normally, and cooperative. Vital signs are reassuring. All findings discussed with the patient and his wife. They admit that he has not eaten today, that he is hungry, and that he needs to take his usual  daily medicines. Kendle Turbin L   Assessment- nonspecific chronic headaches. He takes medications for headache every day. I doubt CVA, meningitis, delirium, metabolic instability or serious bacterial infection.  Medical screening examination/treatment/procedure(s) were conducted as a shared visit with non-physician practitioner(s) and myself.  I personally evaluated the patient during the encounter  Daleen Bo, MD 03/16/15 1945

## 2015-04-10 ENCOUNTER — Telehealth: Payer: Self-pay | Admitting: Diagnostic Neuroimaging

## 2015-04-10 ENCOUNTER — Other Ambulatory Visit: Payer: Self-pay | Admitting: Diagnostic Neuroimaging

## 2015-04-10 NOTE — Telephone Encounter (Signed)
Pts wife called re: headache mgmt. [facility]. Advised her to follow up with that clinic. -VRP

## 2015-04-15 NOTE — Telephone Encounter (Signed)
OV note says patient takes Reglan to aid with migraines

## 2015-07-23 ENCOUNTER — Other Ambulatory Visit: Payer: Self-pay | Admitting: Internal Medicine

## 2015-07-23 DIAGNOSIS — R748 Abnormal levels of other serum enzymes: Secondary | ICD-10-CM

## 2015-07-29 ENCOUNTER — Ambulatory Visit
Admission: RE | Admit: 2015-07-29 | Discharge: 2015-07-29 | Disposition: A | Discharge status: Home / Self Care | Payer: PPO | Source: Ambulatory Visit | Attending: Internal Medicine | Admitting: Internal Medicine

## 2015-07-29 DIAGNOSIS — R748 Abnormal levels of other serum enzymes: Secondary | ICD-10-CM

## 2015-09-14 ENCOUNTER — Ambulatory Visit: Payer: Self-pay | Admitting: Diagnostic Neuroimaging

## 2015-11-06 DIAGNOSIS — M791 Myalgia: Secondary | ICD-10-CM | POA: Diagnosis not present

## 2015-11-06 DIAGNOSIS — M542 Cervicalgia: Secondary | ICD-10-CM | POA: Diagnosis not present

## 2015-11-06 DIAGNOSIS — G43111 Migraine with aura, intractable, with status migrainosus: Secondary | ICD-10-CM | POA: Diagnosis not present

## 2015-11-06 DIAGNOSIS — G43719 Chronic migraine without aura, intractable, without status migrainosus: Secondary | ICD-10-CM | POA: Diagnosis not present

## 2015-11-06 DIAGNOSIS — R51 Headache: Secondary | ICD-10-CM | POA: Diagnosis not present

## 2015-11-06 DIAGNOSIS — G518 Other disorders of facial nerve: Secondary | ICD-10-CM | POA: Diagnosis not present

## 2015-11-26 ENCOUNTER — Encounter (HOSPITAL_COMMUNITY): Payer: Self-pay | Admitting: *Deleted

## 2015-11-26 ENCOUNTER — Emergency Department (HOSPITAL_COMMUNITY)
Admission: EM | Admit: 2015-11-26 | Discharge: 2015-11-26 | Disposition: A | Discharge status: Home / Self Care | Payer: PPO | Attending: Emergency Medicine | Admitting: Emergency Medicine

## 2015-11-26 DIAGNOSIS — Z91040 Latex allergy status: Secondary | ICD-10-CM | POA: Insufficient documentation

## 2015-11-26 DIAGNOSIS — Y998 Other external cause status: Secondary | ICD-10-CM | POA: Diagnosis not present

## 2015-11-26 DIAGNOSIS — Y9289 Other specified places as the place of occurrence of the external cause: Secondary | ICD-10-CM | POA: Diagnosis not present

## 2015-11-26 DIAGNOSIS — Y9389 Activity, other specified: Secondary | ICD-10-CM | POA: Insufficient documentation

## 2015-11-26 DIAGNOSIS — F4310 Post-traumatic stress disorder, unspecified: Secondary | ICD-10-CM | POA: Diagnosis not present

## 2015-11-26 DIAGNOSIS — M797 Fibromyalgia: Secondary | ICD-10-CM | POA: Insufficient documentation

## 2015-11-26 DIAGNOSIS — W268XXA Contact with other sharp object(s), not elsewhere classified, initial encounter: Secondary | ICD-10-CM | POA: Insufficient documentation

## 2015-11-26 DIAGNOSIS — E785 Hyperlipidemia, unspecified: Secondary | ICD-10-CM | POA: Diagnosis not present

## 2015-11-26 DIAGNOSIS — G8929 Other chronic pain: Secondary | ICD-10-CM | POA: Insufficient documentation

## 2015-11-26 DIAGNOSIS — S61217A Laceration without foreign body of left little finger without damage to nail, initial encounter: Secondary | ICD-10-CM | POA: Diagnosis not present

## 2015-11-26 DIAGNOSIS — IMO0002 Reserved for concepts with insufficient information to code with codable children: Secondary | ICD-10-CM

## 2015-11-26 DIAGNOSIS — F410 Panic disorder [episodic paroxysmal anxiety] without agoraphobia: Secondary | ICD-10-CM | POA: Diagnosis not present

## 2015-11-26 DIAGNOSIS — Z79899 Other long term (current) drug therapy: Secondary | ICD-10-CM | POA: Insufficient documentation

## 2015-11-26 DIAGNOSIS — K589 Irritable bowel syndrome without diarrhea: Secondary | ICD-10-CM | POA: Insufficient documentation

## 2015-11-26 DIAGNOSIS — G43909 Migraine, unspecified, not intractable, without status migrainosus: Secondary | ICD-10-CM | POA: Diagnosis not present

## 2015-11-26 DIAGNOSIS — F329 Major depressive disorder, single episode, unspecified: Secondary | ICD-10-CM | POA: Insufficient documentation

## 2015-11-26 DIAGNOSIS — Z23 Encounter for immunization: Secondary | ICD-10-CM | POA: Insufficient documentation

## 2015-11-26 MED ORDER — TETANUS-DIPHTH-ACELL PERTUSSIS 5-2.5-18.5 LF-MCG/0.5 IM SUSP
0.5000 mL | Freq: Once | INTRAMUSCULAR | Status: AC
  Administered 2015-11-26: 0.5 mL via INTRAMUSCULAR
  Filled 2015-11-26: qty 0.5

## 2015-11-26 NOTE — ED Notes (Signed)
Pt states he cut his left pinky while whittling at 10pm. Pt does not remember his last tetanus shot. Bleeding is controlled with pressure dressing. Pt states blood appeared to come out in spurts.

## 2015-11-26 NOTE — ED Provider Notes (Signed)
History  By signing my name below, I, Carlos Mccall, attest that this documentation has been prepared under the direction and in the presence of Carlos Mccall, [place]. Electronically Signed: Marlowe Mccall, ED Scribe. 11/26/2015. 11:29 PM.  Chief Complaint  Patient presents with  . Extremity Laceration   The history is provided by the patient and medical records. No language interpreter was used.    HPI Comments:  Carlos Mccall is a 60 y.o. male who presents to the Emergency Department complaining of a laceration to the left fifth digit that occurred about 1.5 hours ago while whittling with a hand axe. He reports associated bleeding that he has had difficulty controlling. He reports putting potassium ferrous sulfate in the wound to help slow the bleeding. He denies modifying factors. He denies nausea, vomiting, light-headedness or dizziness. He is unsure of his last tetanus vaccination.  Past Medical History  Diagnosis Date  . Head trauma     history of multiple  . Migraines     complex  . Depression   . Allergy   . Hyperlipidemia   . Post traumatic stress disorder (PTSD)   . Migraines   . Spinal stenosis   . IBS (irritable bowel syndrome)   . Tremors of nervous system   . Complication of anesthesia     difficult time waking up, cannot tolerate getting nerve block- pain related  . H/O echocardiogram     also had a tilt table test-showed orthostatic hypotension   . Orthostatic hypotension   . Shortness of breath   . GERD (gastroesophageal reflux disease)     uses peppermint on occasion   . Chronic pain   . Neurocardiogenic syncope   . Glaucoma     pre  . Fibromyalgia     CHRONIC FATIGUE SYNDROME  . Anxiety     PANIC EPISODES  . Anxiety   . Arthritis     lumbar spine stenosis    Past Surgical History  Procedure Laterality Date  . Lumbar laminectomy    . Trigger thumb release    . Tonsillectomy    . Closed reduction shoulder dislocation    . Thumb arthroscopy       both  . Ear cyst excision  08/09/2012    WRIST-R, NOT EARProcedure: CYST REMOVAL;  Surgeon: Cammie Sickle., MD;  Location: [facility];  Service: Orthopedics;  Laterality: Right;  EXCISIONAL BIOPSY  . Orif radial fracture  08/09/2012    Procedure: OPEN REDUCTION INTERNAL FIXATION (ORIF) RADIAL FRACTURE;  Surgeon: Cammie Sickle., MD;  Location: [facility];  Service: Orthopedics;  Laterality: Right;  . Back surgery  1996,07/30/2014  . Hand nerve repair  1983  . Lumbar laminectomy/decompression microdiscectomy Bilateral 07/30/2014    Procedure: Bilateral Lumbar four-five Laminectomy/Foraminotomy;  Surgeon: Floyce Stakes, MD;  Location: [facility];  Service: Neurosurgery;  Laterality: Bilateral;  Bilateral Lumbar four-five Laminectomy/Foraminotomy   Family History  Problem Relation Age of Onset  . Adopted: Yes   Social History  Substance Use Topics  . Smoking status: Never Smoker   . Smokeless tobacco: Never Used  . Alcohol Use: No    Review of Systems  Gastrointestinal: Negative for nausea and vomiting.  Skin: Positive for wound.  Neurological: Negative for dizziness and light-headedness.    Allergies  Antihistamines, chlorpheniramine-type; Codeine; Latex; Quinine derivatives; Shellfish allergy; Tetracyclines & related; and Thimerosal  Home Medications   Prior to Admission medications   Medication Sig Start  Date End Date Taking? Authorizing Provider  ALPHA-LIPOIC ACID PO Take 1 tablet by mouth daily.    Yes Historical Provider, MD  b complex vitamins capsule Take 1 capsule by mouth daily.   Yes Historical Provider, MD  baclofen (LIORESAL) 10 MG tablet TAKE 1 TABLET FOR HEADACHE, UP TO TWICE A DAY. AVOID DAILY USE, LIMIIT USE TO 1-2 DAYS PER WEEK 02/10/15  Yes Historical Provider, MD  Bilberry, Vaccinium myrtillus, (BILBERRY PO) Take 200 mg by mouth daily.   Yes Historical Provider, MD  clonazePAM (KLONOPIN) 2 MG tablet Take 2-6 mg by  mouth See admin instructions. 2 mg in the morning and 6 mg at bedtime   Yes Historical Provider, MD  Coenzyme Q10 (COQ-10) 200 MG CAPS Take 200 mg by mouth 2 (two) times daily.   Yes Historical Provider, MD  hydrOXYzine (ATARAX/VISTARIL) 50 MG tablet Take 50 mg by mouth every 6 (six) hours as needed (sleep).   Yes Historical Provider, MD  Lecithin 1200 MG CAPS Take 1,200 capsules by mouth daily.    Yes Historical Provider, MD  Misc Natural Products (LUTEIN 20) CAPS Take 20 mg by mouth daily.   Yes Historical Provider, MD  Multiple Vitamin (MULTIVITAMIN WITH MINERALS) TABS Take 1 tablet by mouth daily.   Yes Historical Provider, MD  Omega-3 Fatty Acids (FISH OIL) 1200 MG CAPS Take 1,200 mg by mouth daily.    Yes Historical Provider, MD  Oral Electrolytes (THERMOTABS PO) Take 1 tablet by mouth daily as needed (uses when he exercise to maintain electrolytes).   Yes Historical Provider, MD  OVER THE COUNTER MEDICATION Take 1 tablet by mouth daily. Super cal-mag   Yes Historical Provider, MD  OVER THE COUNTER MEDICATION Take 1 tablet by mouth daily. [place]   Yes Historical Provider, MD  Pantothenic Acid 500 MG TABS Take 500 mg by mouth daily.   Yes Historical Provider, MD  potassium gluconate 595 MG TABS tablet Take 595 mg by mouth daily.   Yes Historical Provider, MD  sertraline (ZOLOFT) 25 MG tablet Take 50 mg by mouth at bedtime.  05/31/13  Yes Historical Provider, MD  Sodium Fluoride (PREVIDENT 5000 PLUS DT) Place onto teeth once a week.   Yes Historical Provider, MD  venlafaxine (EFFEXOR-XR) 150 MG 24 hr capsule Take 150 mg by mouth daily.     Yes Historical Provider, MD  zonisamide (ZONEGRAN) 25 MG capsule TAKE 1 CAPSULE IN THE AM THEN TAKE 2 CAPSULES AT BEDTIME 02/10/15  Yes Historical Provider, MD  eletriptan (RELPAX) 40 MG tablet Take 1 tablet (40 mg total) by mouth as needed for migraine (Do not exceed 2 per day or 4 per week). Patient not taking: Reported on 03/11/2015 05/24/13    Penni Bombard, MD  EPINEPHrine (EPIPEN) 0.3 mg/0.3 mL SOAJ Inject 0.3 mg into the muscle as needed (for allergic reactions).    Historical Provider, MD  metoCLOPramide (REGLAN) 10 MG tablet TAKE 1 TABLET BY MOUTH 3 TIMES A DAY AS NEEDED Patient taking differently: TAKE 1 TABLET BY MOUTH 3 TIMES A DAY AS NEEDED FOR PAIN 04/15/15   Penni Bombard, MD  rizatriptan (MAXALT-MLT) 10 MG disintegrating tablet TAKE 1 TABLET (10 MG TOTAL) BY MOUTH AS NEEDED FOR MIGRAINE. MAY REPEAT IN 2 HOURS IF NEEDED Patient not taking: Reported on 11/26/2015 11/14/14   Penni Bombard, MD   Triage Vitals: BP 135/87 mmHg  Pulse 116  Temp(Src) 98.2 F (36.8 C) (Oral)  Resp 16  SpO2 97% Physical  Exam  Constitutional: He is oriented to person, place, and time. He appears well-developed and well-nourished.  HENT:  Head: Normocephalic and atraumatic.  Eyes: EOM are normal.  Neck: Normal range of motion.  Cardiovascular: Normal rate.   Pulmonary/Chest: Effort normal.  Musculoskeletal: Normal range of motion.  Neurological: He is alert and oriented to person, place, and time.  Skin: Skin is warm and dry.  Shallow laceration to left distal small finger. No tendon or obvious ligament injury.  Psychiatric: He has a normal mood and affect. His behavior is normal.  Nursing note and vitals reviewed.   ED Course  Procedures (including critical care time) DIAGNOSTIC STUDIES: Oxygen Saturation is 97% on RA, normal by my interpretation.   COORDINATION OF CARE: 11:27 PM- Will update tetanus vaccination and apply DermaBond to the wound. Pt verbalizes understanding and agrees to plan.  LACERATION REPAIR Performed by: Montine Circle Authorized by: Montine Circle Consent: Verbal consent obtained. Risks and benefits: risks, benefits and alternatives were discussed Consent given by: patient Patient identity confirmed: provided demographic data Prepped and Draped in normal sterile fashion Wound  explored  Laceration Location: left small finger  Laceration Length: 0.5 cm  No Foreign Bodies seen or palpated  Anesthesia: local infiltration  Local anesthetic: none Anesthetic total: none  Irrigation method: syringe Amount of cleaning: standard  Skin closure: dermabond  Number of sutures: dermabond  Technique: dermabond  Patient tolerance: Patient tolerated the procedure well with no immediate complications.   Medications  Tdap (BOOSTRIX) injection 0.5 mL (not administered)    MDM   Final diagnoses:  Laceration    Patient with small lac to finger tip.  Not bleeding now.  Updated tdap.  Dermabond.  The lac is shallow.  I personally performed the services described in this documentation, which was scribed in my presence. The recorded information has been reviewed and is accurate.       Montine Circle, PA-C 11/27/15 0008  Virgel Manifold, MD 11/27/15 [phone]

## 2015-11-26 NOTE — Discharge Instructions (Signed)

## 2015-12-09 DIAGNOSIS — H40013 Open angle with borderline findings, low risk, bilateral: Secondary | ICD-10-CM | POA: Diagnosis not present

## 2015-12-09 DIAGNOSIS — G43809 Other migraine, not intractable, without status migrainosus: Secondary | ICD-10-CM | POA: Diagnosis not present

## 2016-01-07 DIAGNOSIS — M542 Cervicalgia: Secondary | ICD-10-CM | POA: Diagnosis not present

## 2016-01-07 DIAGNOSIS — G43111 Migraine with aura, intractable, with status migrainosus: Secondary | ICD-10-CM | POA: Diagnosis not present

## 2016-01-07 DIAGNOSIS — G43719 Chronic migraine without aura, intractable, without status migrainosus: Secondary | ICD-10-CM | POA: Diagnosis not present

## 2016-01-07 DIAGNOSIS — M791 Myalgia: Secondary | ICD-10-CM | POA: Diagnosis not present

## 2016-01-07 DIAGNOSIS — R51 Headache: Secondary | ICD-10-CM | POA: Diagnosis not present

## 2016-01-07 DIAGNOSIS — G518 Other disorders of facial nerve: Secondary | ICD-10-CM | POA: Diagnosis not present

## 2016-03-15 DIAGNOSIS — G518 Other disorders of facial nerve: Secondary | ICD-10-CM | POA: Diagnosis not present

## 2016-03-15 DIAGNOSIS — M542 Cervicalgia: Secondary | ICD-10-CM | POA: Diagnosis not present

## 2016-03-15 DIAGNOSIS — M791 Myalgia: Secondary | ICD-10-CM | POA: Diagnosis not present

## 2016-03-15 DIAGNOSIS — R51 Headache: Secondary | ICD-10-CM | POA: Diagnosis not present

## 2016-03-15 DIAGNOSIS — G43111 Migraine with aura, intractable, with status migrainosus: Secondary | ICD-10-CM | POA: Diagnosis not present

## 2016-03-15 DIAGNOSIS — G43719 Chronic migraine without aura, intractable, without status migrainosus: Secondary | ICD-10-CM | POA: Diagnosis not present

## 2016-06-22 DIAGNOSIS — H5213 Myopia, bilateral: Secondary | ICD-10-CM | POA: Diagnosis not present

## 2016-06-22 DIAGNOSIS — H524 Presbyopia: Secondary | ICD-10-CM | POA: Diagnosis not present

## 2016-06-22 DIAGNOSIS — H40013 Open angle with borderline findings, low risk, bilateral: Secondary | ICD-10-CM | POA: Diagnosis not present

## 2016-06-22 DIAGNOSIS — G43809 Other migraine, not intractable, without status migrainosus: Secondary | ICD-10-CM | POA: Diagnosis not present

## 2016-07-20 DIAGNOSIS — G518 Other disorders of facial nerve: Secondary | ICD-10-CM | POA: Diagnosis not present

## 2016-07-20 DIAGNOSIS — G43719 Chronic migraine without aura, intractable, without status migrainosus: Secondary | ICD-10-CM | POA: Diagnosis not present

## 2016-07-20 DIAGNOSIS — M542 Cervicalgia: Secondary | ICD-10-CM | POA: Diagnosis not present

## 2016-07-20 DIAGNOSIS — G43111 Migraine with aura, intractable, with status migrainosus: Secondary | ICD-10-CM | POA: Diagnosis not present

## 2016-07-20 DIAGNOSIS — R51 Headache: Secondary | ICD-10-CM | POA: Diagnosis not present

## 2016-07-20 DIAGNOSIS — M791 Myalgia: Secondary | ICD-10-CM | POA: Diagnosis not present

## 2016-08-30 ENCOUNTER — Ambulatory Visit (INDEPENDENT_AMBULATORY_CARE_PROVIDER_SITE_OTHER): Payer: PPO | Admitting: Physician Assistant

## 2016-08-30 ENCOUNTER — Encounter: Payer: Self-pay | Admitting: Physician Assistant

## 2016-08-30 VITALS — BP 116/64 | HR 105 | Temp 98.1°F | Resp 16 | Ht 67.25 in | Wt 171.0 lb

## 2016-08-30 DIAGNOSIS — S61412A Laceration without foreign body of left hand, initial encounter: Secondary | ICD-10-CM

## 2016-08-30 NOTE — Progress Notes (Signed)
Carlos Mccall  MRN: [id number] DOB: 1956/01/16  Subjective:  Pt presents to clinic with laceration on this thenar eminence with a knife that he was carving a wood bowl with earlier today.  It would not stop bleeding and he used some clotting material with zinc in it that stopped the bleeding but he thought that it needs to be closed so he came into clinic for evaluation.  He had his tetanus in 11/2015.  Review of Systems  Skin: Positive for wound.   Patient Active Problem List   Diagnosis Date Noted  . Lumbar stenosis 07/30/2014  . Migraine with aura 06/19/2013  . ANXIETY 01/26/2009  . DEPRESSION 01/26/2009  . UNSPECIFIED PREGLAUCOMA 01/26/2009  . SYNCOPE AND COLLAPSE 01/26/2009  . DIZZINESS 01/26/2009  . HEADACHE 01/26/2009  . DYSPNEA 01/26/2009  . RHEUMATIC FEVER, HX OF 01/26/2009  . LATEX ALLERGY  12/25/2008    Current Outpatient Prescriptions on File Prior to Visit  Medication Sig Dispense Refill  . ALPHA-LIPOIC ACID PO Take 1 tablet by mouth daily.     Marland Kitchen b complex vitamins capsule Take 1 capsule by mouth daily.    . baclofen (LIORESAL) 10 MG tablet TAKE 1 TABLET FOR HEADACHE, UP TO TWICE A DAY. AVOID DAILY USE, LIMIIT USE TO 1-2 DAYS PER WEEK  0  . Bilberry, Vaccinium myrtillus, (BILBERRY PO) Take 200 mg by mouth daily.    . clonazePAM (KLONOPIN) 2 MG tablet Take 2-6 mg by mouth See admin instructions. 2 mg in the morning and 6 mg at bedtime    . Coenzyme Q10 (COQ-10) 200 MG CAPS Take 200 mg by mouth 2 (two) times daily.    Marland Kitchen eletriptan (RELPAX) 40 MG tablet Take 1 tablet (40 mg total) by mouth as needed for migraine (Do not exceed 2 per day or 4 per week). 8 tablet 0  . EPINEPHrine (EPIPEN) 0.3 mg/0.3 mL SOAJ Inject 0.3 mg into the muscle as needed (for allergic reactions).    . hydrOXYzine (ATARAX/VISTARIL) 50 MG tablet Take 50 mg by mouth every 6 (six) hours as needed (sleep).    . Lecithin 1200 MG CAPS Take 1,200 capsules by mouth daily.     . metoCLOPramide (REGLAN)  10 MG tablet TAKE 1 TABLET BY MOUTH 3 TIMES A DAY AS NEEDED (Patient taking differently: TAKE 1 TABLET BY MOUTH 3 TIMES A DAY AS NEEDED FOR PAIN) 30 tablet 6  . Misc Natural Products (LUTEIN 20) CAPS Take 20 mg by mouth daily.    . Multiple Vitamin (MULTIVITAMIN WITH MINERALS) TABS Take 1 tablet by mouth daily.    . Omega-3 Fatty Acids (FISH OIL) 1200 MG CAPS Take 1,200 mg by mouth daily.     . Oral Electrolytes (THERMOTABS PO) Take 1 tablet by mouth daily as needed (uses when he exercise to maintain electrolytes).    Marland Kitchen OVER THE COUNTER MEDICATION Take 1 tablet by mouth daily. Super cal-mag    . OVER THE COUNTER MEDICATION Take 1 tablet by mouth daily. UltraFlora Spectrum    . Pantothenic Acid 500 MG TABS Take 500 mg by mouth daily.    . potassium gluconate 595 MG TABS tablet Take 595 mg by mouth daily.    . rizatriptan (MAXALT-MLT) 10 MG disintegrating tablet TAKE 1 TABLET (10 MG TOTAL) BY MOUTH AS NEEDED FOR MIGRAINE. MAY REPEAT IN 2 HOURS IF NEEDED 9 tablet 11  . sertraline (ZOLOFT) 25 MG tablet Take 50 mg by mouth at bedtime.     Marland Kitchen  Sodium Fluoride (PREVIDENT 5000 PLUS DT) Place onto teeth once a week.    . venlafaxine (EFFEXOR-XR) 150 MG 24 hr capsule Take 150 mg by mouth daily.      Marland Kitchen zonisamide (ZONEGRAN) 25 MG capsule TAKE 1 CAPSULE IN THE AM THEN TAKE 2 CAPSULES AT BEDTIME  1   No current facility-administered medications on file prior to visit.     Allergies  Allergen Reactions  . Antihistamines, Chlorpheniramine-Type     Low tolerance, reports that it gives him migraines   . Codeine     REACTION: RASH/JITTERS  Hallucinations COUGH MEDS  . Latex Other (See Comments)    Only if patient wears it himself, causes rash  . Quinine Derivatives Other (See Comments)    migraine  . Shellfish Allergy     migraine  . Tetracyclines & Related Nausea And Vomiting    headache  . Thimerosal     Pt patients past, family and social history were reviewed and updated.   Objective:  BP  116/64 (BP Location: Right Arm, Patient Position: Sitting, Cuff Size: Normal)   Pulse (!) 105   Temp 98.1 F (36.7 C) (Oral)   Resp 16   Ht 5' 7.25" (1.708 m)   Wt 171 lb (77.6 kg)   SpO2 96%   BMI 26.58 kg/m   Physical Exam  Constitutional: He is oriented to person, place, and time and well-developed, well-nourished, and in no distress.  HENT:  Head: Normocephalic and atraumatic.  Right Ear: External ear normal.  Left Ear: External ear normal.  Eyes: Conjunctivae are normal.  Neck: Normal range of motion.  Pulmonary/Chest: Effort normal.  Neurological: He is alert and oriented to person, place, and time. Gait normal.  Skin: Skin is warm and dry.  1cm laceration on his left thenar eminence - superficial but bleeding is not control   Psychiatric: Mood, memory, affect and judgment normal.   Procedure:  Consent obtained - local anesthesia with 2% lido with epi - wound closed with 5-0 Ethilon #2 horizontal sutures - hemostasis achieved with suturing.  Drsg placed. Assessment and Plan :  Laceration of left hand without foreign body, initial encounter  Wound care d/w pt. SR   Windell Hummingbird PA-C  [facility] 08/30/2016 5:41 PM

## 2016-09-14 DIAGNOSIS — M542 Cervicalgia: Secondary | ICD-10-CM | POA: Diagnosis not present

## 2016-09-14 DIAGNOSIS — M791 Myalgia: Secondary | ICD-10-CM | POA: Diagnosis not present

## 2016-09-14 DIAGNOSIS — G43111 Migraine with aura, intractable, with status migrainosus: Secondary | ICD-10-CM | POA: Diagnosis not present

## 2016-09-14 DIAGNOSIS — G43719 Chronic migraine without aura, intractable, without status migrainosus: Secondary | ICD-10-CM | POA: Diagnosis not present

## 2016-09-14 DIAGNOSIS — G518 Other disorders of facial nerve: Secondary | ICD-10-CM | POA: Diagnosis not present

## 2016-09-14 DIAGNOSIS — R51 Headache: Secondary | ICD-10-CM | POA: Diagnosis not present

## 2016-11-09 DIAGNOSIS — M791 Myalgia: Secondary | ICD-10-CM | POA: Diagnosis not present

## 2016-11-09 DIAGNOSIS — G518 Other disorders of facial nerve: Secondary | ICD-10-CM | POA: Diagnosis not present

## 2016-11-09 DIAGNOSIS — G43111 Migraine with aura, intractable, with status migrainosus: Secondary | ICD-10-CM | POA: Diagnosis not present

## 2016-11-09 DIAGNOSIS — G43719 Chronic migraine without aura, intractable, without status migrainosus: Secondary | ICD-10-CM | POA: Diagnosis not present

## 2016-11-09 DIAGNOSIS — R51 Headache: Secondary | ICD-10-CM | POA: Diagnosis not present

## 2016-11-09 DIAGNOSIS — M542 Cervicalgia: Secondary | ICD-10-CM | POA: Diagnosis not present

## 2016-12-22 DIAGNOSIS — M542 Cervicalgia: Secondary | ICD-10-CM | POA: Diagnosis not present

## 2016-12-22 DIAGNOSIS — R51 Headache: Secondary | ICD-10-CM | POA: Diagnosis not present

## 2016-12-22 DIAGNOSIS — G43719 Chronic migraine without aura, intractable, without status migrainosus: Secondary | ICD-10-CM | POA: Diagnosis not present

## 2016-12-22 DIAGNOSIS — G43111 Migraine with aura, intractable, with status migrainosus: Secondary | ICD-10-CM | POA: Diagnosis not present

## 2016-12-22 DIAGNOSIS — M791 Myalgia: Secondary | ICD-10-CM | POA: Diagnosis not present

## 2016-12-22 DIAGNOSIS — G518 Other disorders of facial nerve: Secondary | ICD-10-CM | POA: Diagnosis not present

## 2017-02-07 DIAGNOSIS — G43719 Chronic migraine without aura, intractable, without status migrainosus: Secondary | ICD-10-CM | POA: Diagnosis not present

## 2017-02-07 DIAGNOSIS — G43111 Migraine with aura, intractable, with status migrainosus: Secondary | ICD-10-CM | POA: Diagnosis not present

## 2017-02-07 DIAGNOSIS — M542 Cervicalgia: Secondary | ICD-10-CM | POA: Diagnosis not present

## 2017-02-07 DIAGNOSIS — G518 Other disorders of facial nerve: Secondary | ICD-10-CM | POA: Diagnosis not present

## 2017-02-07 DIAGNOSIS — R51 Headache: Secondary | ICD-10-CM | POA: Diagnosis not present

## 2017-02-07 DIAGNOSIS — M791 Myalgia: Secondary | ICD-10-CM | POA: Diagnosis not present

## 2017-02-21 DIAGNOSIS — G629 Polyneuropathy, unspecified: Secondary | ICD-10-CM | POA: Diagnosis not present

## 2017-02-21 DIAGNOSIS — G5600 Carpal tunnel syndrome, unspecified upper limb: Secondary | ICD-10-CM | POA: Diagnosis not present

## 2017-03-02 DIAGNOSIS — Z0000 Encounter for general adult medical examination without abnormal findings: Secondary | ICD-10-CM | POA: Diagnosis not present

## 2017-03-02 DIAGNOSIS — E785 Hyperlipidemia, unspecified: Secondary | ICD-10-CM | POA: Diagnosis not present

## 2017-03-02 DIAGNOSIS — Z125 Encounter for screening for malignant neoplasm of prostate: Secondary | ICD-10-CM | POA: Diagnosis not present

## 2017-03-09 DIAGNOSIS — Z0000 Encounter for general adult medical examination without abnormal findings: Secondary | ICD-10-CM | POA: Diagnosis not present

## 2017-03-09 DIAGNOSIS — E7800 Pure hypercholesterolemia, unspecified: Secondary | ICD-10-CM | POA: Diagnosis not present

## 2017-03-09 DIAGNOSIS — R739 Hyperglycemia, unspecified: Secondary | ICD-10-CM | POA: Diagnosis not present

## 2017-06-16 DIAGNOSIS — M542 Cervicalgia: Secondary | ICD-10-CM | POA: Diagnosis not present

## 2017-06-16 DIAGNOSIS — R51 Headache: Secondary | ICD-10-CM | POA: Diagnosis not present

## 2017-06-16 DIAGNOSIS — M791 Myalgia: Secondary | ICD-10-CM | POA: Diagnosis not present

## 2017-06-16 DIAGNOSIS — G43719 Chronic migraine without aura, intractable, without status migrainosus: Secondary | ICD-10-CM | POA: Diagnosis not present

## 2017-06-16 DIAGNOSIS — G518 Other disorders of facial nerve: Secondary | ICD-10-CM | POA: Diagnosis not present

## 2017-06-16 DIAGNOSIS — G43111 Migraine with aura, intractable, with status migrainosus: Secondary | ICD-10-CM | POA: Diagnosis not present

## 2017-06-28 DIAGNOSIS — H5213 Myopia, bilateral: Secondary | ICD-10-CM | POA: Diagnosis not present

## 2017-06-28 DIAGNOSIS — H25813 Combined forms of age-related cataract, bilateral: Secondary | ICD-10-CM | POA: Diagnosis not present

## 2017-06-28 DIAGNOSIS — H40013 Open angle with borderline findings, low risk, bilateral: Secondary | ICD-10-CM | POA: Diagnosis not present

## 2017-06-28 DIAGNOSIS — H524 Presbyopia: Secondary | ICD-10-CM | POA: Diagnosis not present

## 2017-06-28 DIAGNOSIS — G43809 Other migraine, not intractable, without status migrainosus: Secondary | ICD-10-CM | POA: Diagnosis not present

## 2017-09-04 DIAGNOSIS — G43111 Migraine with aura, intractable, with status migrainosus: Secondary | ICD-10-CM | POA: Diagnosis not present

## 2017-09-04 DIAGNOSIS — G43719 Chronic migraine without aura, intractable, without status migrainosus: Secondary | ICD-10-CM | POA: Diagnosis not present

## 2017-09-04 DIAGNOSIS — M542 Cervicalgia: Secondary | ICD-10-CM | POA: Diagnosis not present

## 2017-09-04 DIAGNOSIS — G518 Other disorders of facial nerve: Secondary | ICD-10-CM | POA: Diagnosis not present

## 2017-09-04 DIAGNOSIS — M791 Myalgia, unspecified site: Secondary | ICD-10-CM | POA: Diagnosis not present

## 2017-09-04 DIAGNOSIS — R51 Headache: Secondary | ICD-10-CM | POA: Diagnosis not present

## 2017-09-08 DIAGNOSIS — R739 Hyperglycemia, unspecified: Secondary | ICD-10-CM | POA: Diagnosis not present

## 2017-09-08 DIAGNOSIS — E7800 Pure hypercholesterolemia, unspecified: Secondary | ICD-10-CM | POA: Diagnosis not present

## 2017-09-15 DIAGNOSIS — N179 Acute kidney failure, unspecified: Secondary | ICD-10-CM | POA: Diagnosis not present

## 2017-09-15 DIAGNOSIS — M5432 Sciatica, left side: Secondary | ICD-10-CM | POA: Diagnosis not present

## 2017-09-15 DIAGNOSIS — M5431 Sciatica, right side: Secondary | ICD-10-CM | POA: Diagnosis not present

## 2017-09-18 ENCOUNTER — Emergency Department (HOSPITAL_COMMUNITY): Payer: PPO

## 2017-09-18 ENCOUNTER — Encounter (HOSPITAL_COMMUNITY): Payer: Self-pay | Admitting: Emergency Medicine

## 2017-09-18 ENCOUNTER — Inpatient Hospital Stay (HOSPITAL_COMMUNITY)
Admission: EM | Admit: 2017-09-18 | Discharge: 2017-09-20 | DRG: 871 | Disposition: A | Discharge status: Home / Self Care | Payer: PPO | Attending: Family Medicine | Admitting: Family Medicine

## 2017-09-18 ENCOUNTER — Other Ambulatory Visit: Payer: Self-pay

## 2017-09-18 DIAGNOSIS — Z888 Allergy status to other drugs, medicaments and biological substances status: Secondary | ICD-10-CM | POA: Diagnosis not present

## 2017-09-18 DIAGNOSIS — R4701 Aphasia: Secondary | ICD-10-CM | POA: Diagnosis present

## 2017-09-18 DIAGNOSIS — N179 Acute kidney failure, unspecified: Secondary | ICD-10-CM | POA: Diagnosis not present

## 2017-09-18 DIAGNOSIS — Z91040 Latex allergy status: Secondary | ICD-10-CM

## 2017-09-18 DIAGNOSIS — Z91013 Allergy to seafood: Secondary | ICD-10-CM | POA: Diagnosis not present

## 2017-09-18 DIAGNOSIS — G9341 Metabolic encephalopathy: Secondary | ICD-10-CM | POA: Diagnosis not present

## 2017-09-18 DIAGNOSIS — F329 Major depressive disorder, single episode, unspecified: Secondary | ICD-10-CM | POA: Diagnosis present

## 2017-09-18 DIAGNOSIS — M5117 Intervertebral disc disorders with radiculopathy, lumbosacral region: Secondary | ICD-10-CM | POA: Diagnosis not present

## 2017-09-18 DIAGNOSIS — A419 Sepsis, unspecified organism: Secondary | ICD-10-CM | POA: Diagnosis not present

## 2017-09-18 DIAGNOSIS — M4807 Spinal stenosis, lumbosacral region: Secondary | ICD-10-CM | POA: Diagnosis not present

## 2017-09-18 DIAGNOSIS — G8929 Other chronic pain: Secondary | ICD-10-CM | POA: Diagnosis not present

## 2017-09-18 DIAGNOSIS — I160 Hypertensive urgency: Secondary | ICD-10-CM | POA: Diagnosis not present

## 2017-09-18 DIAGNOSIS — M5126 Other intervertebral disc displacement, lumbar region: Secondary | ICD-10-CM | POA: Diagnosis not present

## 2017-09-18 DIAGNOSIS — R51 Headache: Secondary | ICD-10-CM | POA: Diagnosis not present

## 2017-09-18 DIAGNOSIS — R443 Hallucinations, unspecified: Secondary | ICD-10-CM | POA: Diagnosis not present

## 2017-09-18 DIAGNOSIS — R410 Disorientation, unspecified: Secondary | ICD-10-CM

## 2017-09-18 DIAGNOSIS — K219 Gastro-esophageal reflux disease without esophagitis: Secondary | ICD-10-CM | POA: Diagnosis present

## 2017-09-18 DIAGNOSIS — M545 Low back pain: Secondary | ICD-10-CM | POA: Diagnosis not present

## 2017-09-18 DIAGNOSIS — R9431 Abnormal electrocardiogram [ECG] [EKG]: Secondary | ICD-10-CM | POA: Diagnosis not present

## 2017-09-18 DIAGNOSIS — E871 Hypo-osmolality and hyponatremia: Secondary | ICD-10-CM | POA: Diagnosis present

## 2017-09-18 DIAGNOSIS — F319 Bipolar disorder, unspecified: Secondary | ICD-10-CM | POA: Diagnosis present

## 2017-09-18 DIAGNOSIS — F32A Depression, unspecified: Secondary | ICD-10-CM | POA: Diagnosis present

## 2017-09-18 DIAGNOSIS — G43909 Migraine, unspecified, not intractable, without status migrainosus: Secondary | ICD-10-CM | POA: Diagnosis present

## 2017-09-18 DIAGNOSIS — E872 Acidosis: Secondary | ICD-10-CM | POA: Diagnosis not present

## 2017-09-18 DIAGNOSIS — R509 Fever, unspecified: Secondary | ICD-10-CM | POA: Diagnosis not present

## 2017-09-18 DIAGNOSIS — E785 Hyperlipidemia, unspecified: Secondary | ICD-10-CM | POA: Diagnosis not present

## 2017-09-18 DIAGNOSIS — M797 Fibromyalgia: Secondary | ICD-10-CM | POA: Diagnosis present

## 2017-09-18 DIAGNOSIS — G43109 Migraine with aura, not intractable, without status migrainosus: Secondary | ICD-10-CM | POA: Diagnosis not present

## 2017-09-18 DIAGNOSIS — Z885 Allergy status to narcotic agent status: Secondary | ICD-10-CM | POA: Diagnosis not present

## 2017-09-18 DIAGNOSIS — M5417 Radiculopathy, lumbosacral region: Secondary | ICD-10-CM | POA: Diagnosis present

## 2017-09-18 DIAGNOSIS — J9601 Acute respiratory failure with hypoxia: Secondary | ICD-10-CM | POA: Diagnosis not present

## 2017-09-18 LAB — CBC WITH DIFFERENTIAL/PLATELET
Basophils Absolute: 0 10*3/uL (ref 0.0–0.1)
Basophils Relative: 0 %
EOS ABS: 0 10*3/uL (ref 0.0–0.7)
Eosinophils Relative: 0 %
HCT: 39.5 % (ref 39.0–52.0)
Hemoglobin: 14.5 g/dL (ref 13.0–17.0)
LYMPHS ABS: 1.1 10*3/uL (ref 0.7–4.0)
Lymphocytes Relative: 5 %
MCH: 30.7 pg (ref 26.0–34.0)
MCHC: 36.7 g/dL — AB (ref 30.0–36.0)
MCV: 83.7 fL (ref 78.0–100.0)
MONOS PCT: 8 %
Monocytes Absolute: 1.6 10*3/uL — ABNORMAL HIGH (ref 0.1–1.0)
Neutro Abs: 17.3 10*3/uL — ABNORMAL HIGH (ref 1.7–7.7)
Neutrophils Relative %: 87 %
Platelets: 190 10*3/uL (ref 150–400)
RBC: 4.72 MIL/uL (ref 4.22–5.81)
RDW: 12.4 % (ref 11.5–15.5)
WBC: 20 10*3/uL — ABNORMAL HIGH (ref 4.0–10.5)

## 2017-09-18 LAB — I-STAT ARTERIAL BLOOD GAS, ED
ACID-BASE DEFICIT: 3 mmol/L — AB (ref 0.0–2.0)
Bicarbonate: 20 mmol/L (ref 20.0–28.0)
O2 SAT: 97 %
PCO2 ART: 32.6 mmHg (ref 32.0–48.0)
PH ART: 7.404 (ref 7.350–7.450)
PO2 ART: 103 mmHg (ref 83.0–108.0)
Patient temperature: 102
TCO2: 21 mmol/L — AB (ref 22–32)

## 2017-09-18 LAB — URINALYSIS, ROUTINE W REFLEX MICROSCOPIC
Bilirubin Urine: NEGATIVE
Glucose, UA: NEGATIVE mg/dL
Hgb urine dipstick: NEGATIVE
Ketones, ur: NEGATIVE mg/dL
Leukocytes, UA: NEGATIVE
Nitrite: NEGATIVE
Protein, ur: NEGATIVE mg/dL
Specific Gravity, Urine: 1.008 (ref 1.005–1.030)
pH: 8 (ref 5.0–8.0)

## 2017-09-18 LAB — RAPID URINE DRUG SCREEN, HOSP PERFORMED
Amphetamines: NOT DETECTED
BARBITURATES: NOT DETECTED
BENZODIAZEPINES: NOT DETECTED
COCAINE: NOT DETECTED
Opiates: NOT DETECTED
TETRAHYDROCANNABINOL: NOT DETECTED

## 2017-09-18 LAB — BASIC METABOLIC PANEL
Anion gap: 13 (ref 5–15)
BUN: 12 mg/dL (ref 6–20)
CALCIUM: 10 mg/dL (ref 8.9–10.3)
CO2: 21 mmol/L — ABNORMAL LOW (ref 22–32)
CREATININE: 1.72 mg/dL — AB (ref 0.61–1.24)
Chloride: 97 mmol/L — ABNORMAL LOW (ref 101–111)
GFR calc Af Amer: 47 mL/min — ABNORMAL LOW (ref >60)
GFR calc non Af Amer: 41 mL/min — ABNORMAL LOW (ref >60)
Glucose, Bld: 116 mg/dL — ABNORMAL HIGH (ref 65–99)
Potassium: 4.1 mmol/L (ref 3.5–5.1)
SODIUM: 131 mmol/L — AB (ref 135–145)

## 2017-09-18 LAB — PROTIME-INR
INR: 1.13
PROTHROMBIN TIME: 14.4 s (ref 11.4–15.2)

## 2017-09-18 LAB — SEDIMENTATION RATE: SED RATE: 1 mm/h (ref 0–16)

## 2017-09-18 LAB — AMMONIA: Ammonia: 30 umol/L (ref 9–35)

## 2017-09-18 LAB — I-STAT CG4 LACTIC ACID, ED: Lactic Acid, Venous: 2.72 mmol/L (ref 0.5–1.9)

## 2017-09-18 LAB — LACTIC ACID, PLASMA: Lactic Acid, Venous: 1.3 mmol/L (ref 0.5–1.9)

## 2017-09-18 LAB — C-REACTIVE PROTEIN

## 2017-09-18 LAB — APTT: APTT: 29 s (ref 24–36)

## 2017-09-18 LAB — PROCALCITONIN: Procalcitonin: <0.1 ng/mL

## 2017-09-18 MED ORDER — CLONAZEPAM 1 MG PO TABS
1.0000 mg | ORAL_TABLET | Freq: Three times a day (TID) | ORAL | Status: DC
  Administered 2017-09-19 – 2017-09-20 (×4): 1 mg via ORAL
  Filled 2017-09-18 (×4): qty 1

## 2017-09-18 MED ORDER — ONDANSETRON HCL 4 MG/2ML IJ SOLN
4.0000 mg | Freq: Four times a day (QID) | INTRAMUSCULAR | Status: DC | PRN
  Administered 2017-09-18: 4 mg via INTRAVENOUS
  Filled 2017-09-18: qty 2

## 2017-09-18 MED ORDER — SERTRALINE HCL 50 MG PO TABS
50.0000 mg | ORAL_TABLET | Freq: Every day | ORAL | Status: DC
  Administered 2017-09-18 – 2017-09-19 (×2): 50 mg via ORAL
  Filled 2017-09-18 (×3): qty 1

## 2017-09-18 MED ORDER — ZONISAMIDE 25 MG PO CAPS
25.0000 mg | ORAL_CAPSULE | Freq: Every day | ORAL | Status: DC
  Administered 2017-09-19 – 2017-09-20 (×2): 25 mg via ORAL
  Filled 2017-09-18 (×2): qty 1

## 2017-09-18 MED ORDER — KETOROLAC TROMETHAMINE 30 MG/ML IJ SOLN
30.0000 mg | Freq: Once | INTRAMUSCULAR | Status: AC
  Administered 2017-09-18: 30 mg via INTRAVENOUS
  Filled 2017-09-18: qty 1

## 2017-09-18 MED ORDER — ENOXAPARIN SODIUM 40 MG/0.4ML ~~LOC~~ SOLN
40.0000 mg | SUBCUTANEOUS | Status: DC
Start: 2017-09-18 — End: 2017-09-20
  Administered 2017-09-18 – 2017-09-19 (×2): 40 mg via SUBCUTANEOUS
  Filled 2017-09-18 (×2): qty 0.4

## 2017-09-18 MED ORDER — LORAZEPAM 2 MG/ML IJ SOLN
1.0000 mg | Freq: Once | INTRAMUSCULAR | Status: AC
  Administered 2017-09-18: 1 mg via INTRAVENOUS
  Filled 2017-09-18: qty 1

## 2017-09-18 MED ORDER — VANCOMYCIN HCL 10 G IV SOLR
1500.0000 mg | Freq: Once | INTRAVENOUS | Status: AC
  Administered 2017-09-18: 1500 mg via INTRAVENOUS
  Filled 2017-09-18: qty 1500

## 2017-09-18 MED ORDER — ACETAMINOPHEN 500 MG PO TABS
1000.0000 mg | ORAL_TABLET | Freq: Once | ORAL | Status: AC
  Administered 2017-09-18: 1000 mg via ORAL
  Filled 2017-09-18: qty 2

## 2017-09-18 MED ORDER — VENLAFAXINE HCL ER 75 MG PO CP24
150.0000 mg | ORAL_CAPSULE | Freq: Every day | ORAL | Status: DC
  Administered 2017-09-19 – 2017-09-20 (×2): 150 mg via ORAL
  Filled 2017-09-18 (×2): qty 2

## 2017-09-18 MED ORDER — PIPERACILLIN-TAZOBACTAM 3.375 G IVPB
3.3750 g | Freq: Three times a day (TID) | INTRAVENOUS | Status: DC
  Administered 2017-09-18 – 2017-09-20 (×6): 3.375 g via INTRAVENOUS
  Filled 2017-09-18 (×6): qty 50

## 2017-09-18 MED ORDER — ZONISAMIDE 25 MG PO CAPS: 25.0000 mg | ORAL_CAPSULE | ORAL | Status: DC

## 2017-09-18 MED ORDER — SODIUM CHLORIDE 0.9 % IV SOLN
INTRAVENOUS | Status: DC
  Administered 2017-09-18 – 2017-09-20 (×3): via INTRAVENOUS

## 2017-09-18 MED ORDER — VANCOMYCIN HCL IN DEXTROSE 750-5 MG/150ML-% IV SOLN
750.0000 mg | Freq: Two times a day (BID) | INTRAVENOUS | Status: DC
  Administered 2017-09-18 – 2017-09-20 (×4): 750 mg via INTRAVENOUS
  Filled 2017-09-18 (×4): qty 150

## 2017-09-18 MED ORDER — VANCOMYCIN HCL IN DEXTROSE 1-5 GM/200ML-% IV SOLN: 1000.0000 mg | Freq: Once | INTRAVENOUS | Status: DC

## 2017-09-18 MED ORDER — PIPERACILLIN-TAZOBACTAM 3.375 G IVPB 30 MIN
3.3750 g | Freq: Once | INTRAVENOUS | Status: AC
  Administered 2017-09-18: 3.375 g via INTRAVENOUS
  Filled 2017-09-18: qty 50

## 2017-09-18 MED ORDER — SODIUM CHLORIDE 0.9 % IV BOLUS (SEPSIS)
1000.0000 mL | Freq: Once | INTRAVENOUS | Status: AC
  Administered 2017-09-18: 1000 mL via INTRAVENOUS

## 2017-09-18 MED ORDER — DIPHENHYDRAMINE HCL 50 MG/ML IJ SOLN
25.0000 mg | Freq: Once | INTRAMUSCULAR | Status: AC
  Administered 2017-09-18: 25 mg via INTRAVENOUS
  Filled 2017-09-18: qty 1

## 2017-09-18 MED ORDER — PROCHLORPERAZINE EDISYLATE 5 MG/ML IJ SOLN
10.0000 mg | Freq: Once | INTRAMUSCULAR | Status: AC
  Administered 2017-09-18: 10 mg via INTRAVENOUS
  Filled 2017-09-18: qty 2

## 2017-09-18 MED ORDER — ZONISAMIDE 25 MG PO CAPS
50.0000 mg | ORAL_CAPSULE | Freq: Every day | ORAL | Status: DC
  Administered 2017-09-18 – 2017-09-19 (×2): 50 mg via ORAL
  Filled 2017-09-18 (×3): qty 2

## 2017-09-18 NOTE — H&P (Signed)
History and Physical    Carlos Mccall TFT:[id number] DOB: Apr 16, 1956 DOA: 09/18/2017   PCP: Jani Gravel, MD   Attending physician: Marthenia Rolling  Patient coming from/Resides with: Private residence/wife   Chief Complaint: Migraine and confusion  HPI: Carlos Mccall is a 61 y.o. male with medical history significant for chronic migraines, chronic back pain, dyslipidemia, depression anxiety fibromyalgia.  Patient is a poor historian due to acute metabolic encephalopathy so history obtained from wife.  Wife reports that beginning about 3 weeks ago he began having significant low back pain as well as increase in migraines.  Around the same time his insurance also denied his typical migraine medicine and he was receiving inadequate migraine therapy.  Presented to the ER complaining of migraine all over that have been ongoing for 4 hours associated with nausea.  He was started on prednisone 2 days ago for back pain.  In the ER rectal temperature was 102 F, patient was hypertensive, he had intermittent hypoxemia when more lethargic with O2 sats decreasing as low as 86%.  He was found to have a mild acute kidney injury with a leukocytosis of 20,000 with left shift (recent initiation of prednisone for back pain), and elevated lactic acid of 2.72.  Patient did not have any meningismus and CT of the head was unremarkable.  Chest x-ray was unremarkable.  Because of the back pain, fever and leukocytosis and MR was obtained by the ER which did not reveal any evidence of discitis did reveal a prominent soft disc extrusion at the L4-L5 region with compression of the right L5 for nerve.  2 fever of unknown origin patient has been treated as early sepsis and was started on empiric broad-spectrum antibiotics with vancomycin and Zosyn.  Blood cultures been obtained.  In discussing with the wife patient does have a history of migraines associated with fevers and confusion in the past.  ED Course:  Vital Signs: BP 122/64    Pulse (!) 109   Temp (!) 102 F (38.9 C) (Rectal)   Resp 17   Ht 5' 11"  (1.803 m)   Wt 71.7 kg (158 lb)   SpO2 92%   BMI 22.04 kg/m  Diagnostic imaging: As above Lab data: Sodium 131, potassium 4.1, chloride 97, CO2 21, glucose 116, BUN 12, creatinine 1.72, calcium 10, ammonia level 30, initial lactic acid 2.72 down to 1.3 after hydration, white count 20,000 with neutrophils 87% and absolute neutrophils 17.3%, hemoglobin 14.5, platelets 190,000, ESR 1, urinalysis unremarkable, urine culture and blood cultures collected but results pending. Medications and treatments: NS bolus times 1 L, Toradol 30 mg IV x1, Tylenol 1000 mg x1, Compazine 10 mg IV x1, Benadryl 25 mg IV x1, Zosyn 3.375 g IV x1, vancomycin 1500 mg IV x1, Ativan 1 mg IV x1  Review of Systems:  In addition to the HPI above,  No Headache, changes with Vision or hearing, new weakness, tingling, numbness in any extremity, dizziness, gait disturbance or imbalance, tremors or seizure activity No problems swallowing food or Liquids, indigestion/reflux, choking or coughing while eating, abdominal pain with or after eating No Chest pain, Cough or Shortness of Breath, palpitations, orthopnea or DOE No Abdominal pain, melena,hematochezia, dark tarry stools, constipation No dysuria, malodorous urine, hematuria or flank pain No new skin rashes, lesions, masses or bruises, No new joint pains, aches, swelling or redness No recent unintentional weight gain or loss No polyuria, polydypsia or polyphagia   Past Medical History:  Diagnosis Date  . Allergy   .  Anxiety    PANIC EPISODES  . Anxiety   . Arthritis    lumbar spine stenosis   . Chronic pain   . Complication of anesthesia    difficult time waking up, cannot tolerate getting nerve block- pain related  . Depression   . Fibromyalgia    CHRONIC FATIGUE SYNDROME  . GERD (gastroesophageal reflux disease)    uses peppermint on occasion   . Glaucoma    pre  . H/O  echocardiogram    also had a tilt table test-showed orthostatic hypotension   . Head trauma    history of multiple  . Hyperlipidemia   . IBS (irritable bowel syndrome)   . Migraines    complex  . Migraines   . Neurocardiogenic syncope   . Orthostatic hypotension   . Post traumatic stress disorder (PTSD)   . Shortness of breath   . Spinal stenosis   . Tremors of nervous system     Past Surgical History:  Procedure Laterality Date  . BACK SURGERY  1996,07/30/2014  . CLOSED REDUCTION SHOULDER DISLOCATION    . EAR CYST EXCISION  08/09/2012   WRIST-R, NOT EARProcedure: CYST REMOVAL;  Surgeon: Cammie Sickle., MD;  Location: [facility];  Service: Orthopedics;  Laterality: Right;  EXCISIONAL BIOPSY  . HAND NERVE REPAIR  1983  . LUMBAR LAMINECTOMY    . LUMBAR LAMINECTOMY/DECOMPRESSION MICRODISCECTOMY Bilateral 07/30/2014   Procedure: Bilateral Lumbar four-five Laminectomy/Foraminotomy;  Surgeon: Floyce Stakes, MD;  Location: [facility];  Service: Neurosurgery;  Laterality: Bilateral;  Bilateral Lumbar four-five Laminectomy/Foraminotomy  . ORIF RADIAL FRACTURE  08/09/2012   Procedure: OPEN REDUCTION INTERNAL FIXATION (ORIF) RADIAL FRACTURE;  Surgeon: Cammie Sickle., MD;  Location: [facility];  Service: Orthopedics;  Laterality: Right;  . THUMB ARTHROSCOPY     both  . TONSILLECTOMY    . trigger thumb release      Social History   Socioeconomic History  . Marital status: Married    Spouse name: Tera Helper  . Number of children: 0  . Years of education: Post Grad  . Highest education level: Not on file  Social Needs  . Financial resource strain: Not on file  . Food insecurity - worry: Not on file  . Food insecurity - inability: Not on file  . Transportation needs - medical: Not on file  . Transportation needs - non-medical: Not on file  Occupational History    Comment: Disabled  Tobacco Use  . Smoking status: Never Smoker  . Smokeless  tobacco: Never Used  Substance and Sexual Activity  . Alcohol use: No  . Drug use: No  . Sexual activity: Not on file  Other Topics Concern  . Not on file  Social History Narrative   Patient lives at home with his spouse.   Caffeine Use: quit in 1995    Mobility: Independent Work history: Not obtained   Allergies  Allergen Reactions  . Antihistamines, Chlorpheniramine-Type     Low tolerance, reports that it gives him migraines   . Quinine Derivatives Other (See Comments)    migraine  . Shellfish Allergy Other (See Comments)    migraine  . Tetracyclines & Related Nausea And Vomiting and Other (See Comments)    headache  . Thimerosal Other (See Comments)    Unknown reaction  . Codeine Rash and Other (See Comments)    Jitters, hallucinations  . Latex Rash and Other (See Comments)    Only if patient  wears it himself, causes rash    Family History  Adopted: Yes      Prior to Admission medications   Medication Sig Start Date End Date Taking? Authorizing Provider  baclofen (LIORESAL) 10 MG tablet Take 10 mg by mouth See admin instructions. Take 1 tablet (10 mg) by mouth at onset of migraine, up to two tablets daily and no more than two days in a week   Yes [provider]  Calcium-Magnesium-Vitamin D (SUPER CAL-MAG-D PO) Take 1 tablet by mouth 2 (two) times daily. Calcium 500 mg, Magnesium 250 mg, Vitamin D 400 units   Yes [provider]  clonazePAM (KLONOPIN) 2 MG tablet Take 1-5 mg by mouth See admin instructions. Take 1 tablet (2 mg) by mouth every morning, take 1/2 tablet (1 mg) late afternoon and 2 1/2 tablet (5 mg) at bedtime   Yes [provider]  EPINEPHrine (EPIPEN) 0.3 mg/0.3 mL SOAJ Inject 0.3 mg into the muscle once as needed (severe allergic reaction).    Yes [provider]  hydrOXYzine (ATARAX/VISTARIL) 25 MG tablet Take 25 mg by mouth at bedtime.  08/20/17  Yes [provider]  Menthol, Topical Analgesic, (BIOFREEZE  EX) Apply 1 application topically 5 (five) times daily as needed (pain).   Yes [provider]  metoCLOPramide (REGLAN) 10 MG tablet TAKE 1 TABLET BY MOUTH 3 TIMES A DAY AS NEEDED Patient taking differently: TAKE 1 TABLET (10 MG) BY MOUTH THREE TIMES DAILY FOR NAUSEA  ASSOCIATED WITH MIGRAINES 04/15/15  Yes Penumalli, Earlean Polka, MD  Pantothenic Acid 500 MG TABS Take 500 mg by mouth 3 (three) times daily.    Yes [provider]  potassium gluconate 595 MG TABS tablet Take 595 mg by mouth 3 (three) times daily.    Yes [provider]  predniSONE (DELTASONE) 10 MG tablet Take 10-40 mg by mouth See admin instructions. Tapered course started 09/16/17 - take 4 tablets (40 mg) by mouth daily for 2 days, then take 3 tablets (30 mg) daily for 2 days, then take 2 tablets (20 mg) daily for 2 days, then take 1 tablet (10 mg) daily for 2 days, then stop. 09/15/17  Yes [provider]  sertraline (ZOLOFT) 25 MG tablet Take 50 mg by mouth at bedtime.  05/31/13  Yes [provider]  venlafaxine (EFFEXOR-XR) 150 MG 24 hr capsule Take 150 mg by mouth daily.     Yes [provider]  zonisamide (ZONEGRAN) 25 MG capsule Take 25-50 mg by mouth See admin instructions. Take 1 capsule (25 mg) by mouth every morning and 2 capsules (50 mg) at night - for migraine prevention   Yes [provider]  eletriptan (RELPAX) 40 MG tablet Take 1 tablet (40 mg total) by mouth as needed for migraine (Do not exceed 2 per day or 4 per week). Patient not taking: Reported on 09/18/2017 05/24/13   Penumalli, Earlean Polka, MD  rizatriptan (MAXALT-MLT) 10 MG disintegrating tablet TAKE 1 TABLET (10 MG TOTAL) BY MOUTH AS NEEDED FOR MIGRAINE. MAY REPEAT IN 2 HOURS IF NEEDED Patient not taking: Reported on 09/18/2017 11/14/14   Penni Bombard, MD    Physical Exam: Vitals:   09/18/17 1245 09/18/17 1300 09/18/17 1315 09/18/17 1335  BP: (!) 142/72 (!) 148/71 105/89 122/64  Pulse: (!) 101 (!)  102 (!) 101 (!) 109  Resp:    17  Temp:      TempSrc:      SpO2: (!) 84% (!) 86% Marland Kitchen)  77% 92%  Weight:      Height:          Constitutional: NAD, mildly anxious but pleasant, comfortable Eyes: PERRL pupils are quite dilated at 6 mm bilaterally (wife states pupils typically are dilated but not this much at baseline), lids and conjunctivae normal ENMT: Mucous membranes are dry. Posterior pharynx clear of any exudate or lesions.Normal dentition.  Neck: normal, supple, no masses, no thyromegaly Respiratory: clear to auscultation bilaterally, no wheezing, no crackles. Normal respiratory effort. No accessory muscle use.  Cardiovascular: Regular rate and rhythm, no murmurs / rubs / gallops. No extremity edema. 2+ pedal pulses. No carotid bruits.  Abdomen: no tenderness, no masses palpated. No hepatosplenomegaly. Bowel sounds positive.  Musculoskeletal: no clubbing / cyanosis. No joint deformity upper and lower extremities. Good ROM, no contractures. Normal muscle tone.  Skin: no rashes, lesions, ulcers. No induration Neurologic: CN 2-12 grossly intact. Sensation intact, DTR normal. Strength 5/5 x all 4 extremities.  No focal neurological deficits. Psychiatric: Awake and alert but clearly confused, unable to answer basic orientation questions was only oriented to name.   Labs on Admission: I have personally reviewed following labs and imaging studies  CBC: Recent Labs  Lab 09/18/17 0537  WBC 20.0*  NEUTROABS 17.3*  HGB 14.5  HCT 39.5  MCV 83.7  PLT 628   Basic Metabolic Panel: Recent Labs  Lab 09/18/17 0537  NA 131*  K 4.1  CL 97*  CO2 21*  GLUCOSE 116*  BUN 12  CREATININE 1.72*  CALCIUM 10.0   GFR: Estimated Creatinine Clearance: 45.2 mL/min (A) (by C-G formula based on SCr of 1.72 mg/dL (H)). Liver Function Tests: No results for input(s): AST, ALT, ALKPHOS, BILITOT, PROT, ALBUMIN in the last 168 hours. No results for input(s): LIPASE, AMYLASE in the last 168  hours. Recent Labs  Lab 09/18/17 1034  AMMONIA 30   Coagulation Profile: No results for input(s): INR, PROTIME in the last 168 hours. Cardiac Enzymes: No results for input(s): CKTOTAL, CKMB, CKMBINDEX, TROPONINI in the last 168 hours. BNP (last 3 results) No results for input(s): PROBNP in the last 8760 hours. HbA1C: No results for input(s): HGBA1C in the last 72 hours. CBG: No results for input(s): GLUCAP in the last 168 hours. Lipid Profile: No results for input(s): CHOL, HDL, LDLCALC, TRIG, CHOLHDL, LDLDIRECT in the last 72 hours. Thyroid Function Tests: No results for input(s): TSH, T4TOTAL, FREET4, T3FREE, THYROIDAB in the last 72 hours. Anemia Panel: No results for input(s): VITAMINB12, FOLATE, FERRITIN, TIBC, IRON, RETICCTPCT in the last 72 hours. Urine analysis:    Component Value Date/Time   COLORURINE STRAW (A) 09/18/2017 1243   APPEARANCEUR CLEAR 09/18/2017 1243   LABSPEC 1.008 09/18/2017 1243   PHURINE 8.0 09/18/2017 1243   GLUCOSEU NEGATIVE 09/18/2017 1243   HGBUR NEGATIVE 09/18/2017 1243   BILIRUBINUR NEGATIVE 09/18/2017 1243   KETONESUR NEGATIVE 09/18/2017 1243   PROTEINUR NEGATIVE 09/18/2017 1243   NITRITE NEGATIVE 09/18/2017 1243   LEUKOCYTESUR NEGATIVE 09/18/2017 1243   Sepsis Labs: @LABRCNTIP (procalcitonin:4,lacticidven:4) )No results found for this or any previous visit (from the past 240 hour(s)).   Radiological Exams on Admission: Dg Chest 2 View  Result Date: 09/18/2017 CLINICAL DATA:  Fever. EXAM: CHEST  2 VIEW COMPARISON:  Radiographs of July 20, 2015. FINDINGS: The heart size and mediastinal contours are within normal limits. Both lungs are clear. No pneumothorax or pleural effusion is noted. The visualized skeletal structures are unremarkable. IMPRESSION: No active cardiopulmonary disease. Electronically Signed  By: Marijo Conception, M.D.   On: 09/18/2017 09:10   Ct Head Wo Contrast  Result Date: 09/18/2017 CLINICAL DATA:  Acute severe  headache.  Worst headache of life. EXAM: CT HEAD WITHOUT CONTRAST TECHNIQUE: Contiguous axial images were obtained from the base of the skull through the vertex without intravenous contrast. COMPARISON:  Head CT 03/11/2015 FINDINGS: Brain: Similar chronic small vessel ischemia. 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 No intracranial hemorrhage, mass effect, or midline shift. No hydrocephalus. The basilar cisterns are patent. No evidence of territorial infarct or acute ischemia. No extra-axial or intracranial fluid collection. Vascular: No hyperdense vessel or unexpected calcification. Skull: No fracture or focal lesion. Sinuses/Orbits: Paranasal sinuses and mastoid air cells are clear. The visualized orbits are unremarkable. Other: None. IMPRESSION: 1.  No acute intracranial abnormality. 2. Unchanged mild chronic small vessel ischemia and remote bilateral basal gangliar lacunar infarcts. Electronically Signed   By: Jeb Levering M.D.   On: 09/18/2017 06:13   Mr Lumbar Spine Wo Contrast  Result Date: 09/18/2017 CLINICAL DATA:  Back pain with rapidly progressive neurological deficit. Previous lumbar surgery. EXAM: MRI LUMBAR SPINE WITHOUT CONTRAST TECHNIQUE: Multiplanar, multisequence MR imaging of the lumbar spine was performed. No intravenous contrast was administered. COMPARISON:  Radiographs dated 08/18/2014 and MRI dated 07/18/2014 FINDINGS: Segmentation:  Standard. Alignment:  2 mm spondylolisthesis at L4-5, new since 2015. Vertebrae:  No fracture, evidence of discitis, or bone lesion. Conus medullaris and cauda equina: Conus extends to the L1-2 level. Conus and cauda equina appear normal. Paraspinal and other soft tissues: Negative. Disc levels: T12-L1 through L2-3:  Normal. L3-4: Normal disc. Congenitally short pedicles. No focal neural impingement. L4-5: There is a 21 x 11 x 8 mm soft disc extrusion extending superiorly on the right behind the body of L4 from the L4-5 disc space. This  extrusion compresses the right side of the thecal sac and should affect the right L4 nerve. There is severe bilateral facet arthritis with a small broad-based bulge of the remainder of the disc. Hypertrophy of the facet joints narrows the transverse dimension of the spinal canal and compresses both lateral recesses, right greater than left best seen on image 36 of series 6. The patient has had posterior decompression at L4-5 and L5-S1. There is moderately severe right foraminal stenosis. L5-S1: Chronic disc space narrowing. Posterior decompression. No focal neural impingement. Moderate right foraminal stenosis. The nerve appears to exit without impingement however. IMPRESSION: 1. Prominent soft disc extrusion at L4-5 on the right extending superiorly behind the body of L4 almost to the level of the L3-4 disc. This compresses the right L4 nerve and the right side of the thecal sac. 2. Bilateral lateral recess impingement in transverse spinal stenosis at L4-5 due to hypertrophy of the facet joints and ligamentum flavum. 3. Moderately severe right foraminal stenosis at L4-5 and moderate right foraminal stenosis at L5-S1. Electronically Signed   By: Lorriane Shire M.D.   On: 09/18/2017 15:23    EKG: (Independently reviewed) sinus rhythm with ventricular rate 99 bpm, QTC 434 ms, normal R wave rotation, no acute ischemic changes  Assessment/Plan Principal Problem:   ?? Sepsis  -Patient presents with fever 102 rectal and leukocytosis in context of migraine with prior history of febrile migraine in the past -Significant leukocytosis possibly related to stress demargination from recent initiate prednisone for back pain -Procalcitonin <0.10 so less likely this is bacterial infection -Follow-up on blood cultures and urine culture -Continue empiric antibiotics for now -Lactic  acid has normalized after hydration -No nuchal rigidity or meningismus signs including no photophobia so no indication at this juncture to  treat as possible meningitis -For completeness of exam check respiratory viral panel  Active Problems:   Acute respiratory failure with hypoxia  -Occurred in the context of hypoventilation while lethargic -More alert male and maintaining O2 saturations of 97% on room air -ABG unremarkable except for mild acidemia with T CO2 21 and acid base deficit of 3.0-no hypoxemia or hypercarbia on room air    Acute metabolic encephalopathy -Secondary to fever and possible infectious causes -So had mild hyponatremia with normal glucose-suspect per natremia secondary to dehydration -Patient much more alert now although he remains confused and oriented to name only -Given acute mental status changes will either hold or adjust home sedative/psychotropic medications (see below) -UDS    Acute kidney injury  -Baseline renal function: 10 and 1.19 -Current renal function 12 and 1.72 -Suspect related to insensible fluid losses from fever, and restlessness and agitation from acute confusion -IV fluids -Labs in a.m.    Lumbosacral radiculopathy at L4 -She has been having ongoing back pain with acute exacerbation over the past 3 weeks -MRI reveals L4 nerve root compression -EDP has asked neurosurgery to review MRI and make comment on plan-please follow-up in the event neurosurgery did not get the opportunity to evaluate the patient today on 12/17 -Given acute confusion will hold preadmission prednisone which was a tapering dose started on 12/15 at 40 mg daily for 2 days then tapering down by 10 mg every 2 days until completed    Chronic pain -Wife reports patient no longer taking regular medications and that migraine medications have recently been adjusted by insurance company i.e. they are no longer paying for medication that worked best -Neurologist is Orie Rout -Patient is not on chronic narcotics    Hyperlipidemia -Diet controlled    Depression -Continue preadmission Zoloft and Effexor    -Currently does not have symptoms consistent with serotonin syndrome -Patient takes high dose Klonopin at home as follows: 2 mg in the a.m., 1 mg late afternoon and 5 mg at hour of sleep -With episode of altered mentation we will only give Klonopin 1 mg 3 times daily for now with first dose to start in the a.m. on 12/18      DVT prophylaxis: Lovenox Code Status: Full Family Communication: Wife Disposition Plan: Home Consults called: EDP placed consult requested neurosurgery to review recent MRI results    Galaxy Borden L. ANP-BC [facility] Pager [phone]   If 7PM-7AM, please contact night-coverage [email or web address] Password [facility]  09/18/2017, 5:56 PM

## 2017-09-18 NOTE — ED Notes (Signed)
Nurse drawing labs. 

## 2017-09-18 NOTE — ED Notes (Signed)
Patient transported to MRI 

## 2017-09-18 NOTE — ED Provider Notes (Signed)
[facility] EMERGENCY DEPARTMENT Provider Note   CSN: [id number] Arrival date & time: 09/18/17  0301     History   Chief Complaint Chief Complaint  Patient presents with  . Migraine    HPI Carlos Mccall is a 61 y.o. male.  HPI Patient presents to the emergency department with altered mental status headache and confusion.  The wife gives me the history patient is unable to tell me much.  The patient started with a headache last night and fever.  The wife states that this is typical for his migraine headaches.  She states that he had chills last night.  She states that he has had no other symptoms such as cough .  The patient states he is having no other pain other than his headache.  The patient initially was not answering my questions.  He does seem to be more responsive at this time.  The patient denies chest pain, shortness of breath,blurred vision, neck pain, cough, weakness, numbness, dizziness, anorexia, edema, abdominal pain, nausea,diarrhea, rash, back pain, dysuria, hematemesis, bloody stool, near syncope, or syncope  The patient seen.  Past Medical History:  Diagnosis Date  . Allergy   . Anxiety    PANIC EPISODES  . Anxiety   . Arthritis    lumbar spine stenosis   . Chronic pain   . Complication of anesthesia    difficult time waking up, cannot tolerate getting nerve block- pain related  . Depression   . Fibromyalgia    CHRONIC FATIGUE SYNDROME  . GERD (gastroesophageal reflux disease)    uses peppermint on occasion   . Glaucoma    pre  . H/O echocardiogram    also had a tilt table test-showed orthostatic hypotension   . Head trauma    history of multiple  . Hyperlipidemia   . IBS (irritable bowel syndrome)   . Migraines    complex  . Migraines   . Neurocardiogenic syncope   . Orthostatic hypotension   . Post traumatic stress disorder (PTSD)   . Shortness of breath   . Spinal stenosis   . Tremors of nervous system     Patient  Active Problem List   Diagnosis Date Noted  . Lumbar stenosis 07/30/2014  . Migraine with aura 06/19/2013  . ANXIETY 01/26/2009  . DEPRESSION 01/26/2009  . UNSPECIFIED PREGLAUCOMA 01/26/2009  . SYNCOPE AND COLLAPSE 01/26/2009  . DIZZINESS 01/26/2009  . HEADACHE 01/26/2009  . DYSPNEA 01/26/2009  . RHEUMATIC FEVER, HX OF 01/26/2009  . Latex allergy 12/25/2008    Past Surgical History:  Procedure Laterality Date  . BACK SURGERY  1996,07/30/2014  . CLOSED REDUCTION SHOULDER DISLOCATION    . EAR CYST EXCISION  08/09/2012   WRIST-R, NOT EARProcedure: CYST REMOVAL;  Surgeon: Cammie Sickle., MD;  Location: [facility];  Service: Orthopedics;  Laterality: Right;  EXCISIONAL BIOPSY  . HAND NERVE REPAIR  1983  . LUMBAR LAMINECTOMY    . LUMBAR LAMINECTOMY/DECOMPRESSION MICRODISCECTOMY Bilateral 07/30/2014   Procedure: Bilateral Lumbar four-five Laminectomy/Foraminotomy;  Surgeon: Floyce Stakes, MD;  Location: [facility];  Service: Neurosurgery;  Laterality: Bilateral;  Bilateral Lumbar four-five Laminectomy/Foraminotomy  . ORIF RADIAL FRACTURE  08/09/2012   Procedure: OPEN REDUCTION INTERNAL FIXATION (ORIF) RADIAL FRACTURE;  Surgeon: Cammie Sickle., MD;  Location: [facility];  Service: Orthopedics;  Laterality: Right;  . THUMB ARTHROSCOPY     both  . TONSILLECTOMY    . trigger thumb release  Home Medications    Prior to Admission medications   Medication Sig Start Date End Date Taking? Authorizing Provider  baclofen (LIORESAL) 10 MG tablet Take 10 mg by mouth See admin instructions. Take 1 tablet (10 mg) by mouth at onset of migraine, up to two tablets daily and no more than two days in a week   Yes [provider]  Calcium-Magnesium-Vitamin D (SUPER CAL-MAG-D PO) Take 1 tablet by mouth 2 (two) times daily. Calcium 500 mg, Magnesium 250 mg, Vitamin D 400 units   Yes [provider]  clonazePAM (KLONOPIN) 2 MG tablet Take  1-5 mg by mouth See admin instructions. Take 1 tablet (2 mg) by mouth every morning, take 1/2 tablet (1 mg) late afternoon and 2 1/2 tablet (5 mg) at bedtime   Yes [provider]  EPINEPHrine (EPIPEN) 0.3 mg/0.3 mL SOAJ Inject 0.3 mg into the muscle once as needed (severe allergic reaction).    Yes [provider]  hydrOXYzine (ATARAX/VISTARIL) 25 MG tablet Take 25 mg by mouth at bedtime.  08/20/17  Yes [provider]  Menthol, Topical Analgesic, (BIOFREEZE EX) Apply 1 application topically 5 (five) times daily as needed (pain).   Yes [provider]  metoCLOPramide (REGLAN) 10 MG tablet TAKE 1 TABLET BY MOUTH 3 TIMES A DAY AS NEEDED Patient taking differently: TAKE 1 TABLET (10 MG) BY MOUTH THREE TIMES DAILY FOR NAUSEA  ASSOCIATED WITH MIGRAINES 04/15/15  Yes Penumalli, Earlean Polka, MD  Pantothenic Acid 500 MG TABS Take 500 mg by mouth 3 (three) times daily.    Yes [provider]  potassium gluconate 595 MG TABS tablet Take 595 mg by mouth 3 (three) times daily.    Yes [provider]  predniSONE (DELTASONE) 10 MG tablet Take 10-40 mg by mouth See admin instructions. Tapered course started 09/16/17 - take 4 tablets (40 mg) by mouth daily for 2 days, then take 3 tablets (30 mg) daily for 2 days, then take 2 tablets (20 mg) daily for 2 days, then take 1 tablet (10 mg) daily for 2 days, then stop. 09/15/17  Yes [provider]  sertraline (ZOLOFT) 25 MG tablet Take 50 mg by mouth at bedtime.  05/31/13  Yes [provider]  venlafaxine (EFFEXOR-XR) 150 MG 24 hr capsule Take 150 mg by mouth daily.     Yes [provider]  zonisamide (ZONEGRAN) 25 MG capsule Take 25-50 mg by mouth See admin instructions. Take 1 capsule (25 mg) by mouth every morning and 2 capsules (50 mg) at night - for migraine prevention   Yes [provider]  eletriptan (RELPAX) 40 MG tablet Take 1 tablet (40 mg total) by mouth as needed for migraine  (Do not exceed 2 per day or 4 per week). Patient not taking: Reported on 09/18/2017 05/24/13   Penumalli, Earlean Polka, MD  rizatriptan (MAXALT-MLT) 10 MG disintegrating tablet TAKE 1 TABLET (10 MG TOTAL) BY MOUTH AS NEEDED FOR MIGRAINE. MAY REPEAT IN 2 HOURS IF NEEDED Patient not taking: Reported on 09/18/2017 11/14/14   Penni Bombard, MD    Family History Family History  Adopted: Yes    Social History Social History   Tobacco Use  . Smoking status: Never Smoker  . Smokeless tobacco: Never Used  Substance Use Topics  . Alcohol use: No  . Drug use: No     Allergies   Antihistamines, chlorpheniramine-type; Quinine derivatives; Shellfish allergy; Tetracyclines & related; Thimerosal; Codeine; and Latex   Review of  Systems Review of Systems  Level 5 caveat applies due to altered mental status Physical Exam Updated Vital Signs BP 122/64   Pulse (!) 109   Temp (!) 102 F (38.9 C) (Rectal)   Resp 17   Ht 5\' 11"  (1.803 m)   Wt 71.7 kg (158 lb)   SpO2 92%   BMI 22.04 kg/m   Physical Exam  Constitutional: He appears well-developed and well-nourished. No distress.  HENT:  Head: Normocephalic and atraumatic.  Mouth/Throat: Oropharynx is clear and moist.  Eyes: Pupils are equal, round, and reactive to light.  Neck: Normal range of motion. Neck supple.  Cardiovascular: Normal rate, regular rhythm and normal heart sounds. Exam reveals no gallop and no friction rub.  No murmur heard. Pulmonary/Chest: Effort normal and breath sounds normal. No respiratory distress. He has no wheezes.  Abdominal: Soft. Bowel sounds are normal. He exhibits no distension. There is no tenderness.  Neurological: He is alert. No sensory deficit. He exhibits normal muscle tone. Coordination normal.  Seems confused. Can't tell me where he is at.  The patient seems to be repeatedly answering questions the same way  Skin: Skin is warm and dry. Capillary refill takes less than 2 seconds. No rash noted. No  erythema.  Psychiatric: He has a normal mood and affect. His behavior is normal.  Nursing note and vitals reviewed.    ED Treatments / Results  Labs (all labs ordered are listed, but only abnormal results are displayed) Labs Reviewed  CBC WITH DIFFERENTIAL/PLATELET - Abnormal; Notable for the following components:      Result Value   WBC 20.0 (*)    MCHC 36.7 (*)    Neutro Abs 17.3 (*)    Monocytes Absolute 1.6 (*)    All other components within normal limits  BASIC METABOLIC PANEL - Abnormal; Notable for the following components:   Sodium 131 (*)    Chloride 97 (*)    CO2 21 (*)    Glucose, Bld 116 (*)    Creatinine, Ser 1.72 (*)    GFR calc non Af Amer 41 (*)    GFR calc Af Amer 47 (*)    All other components within normal limits  URINALYSIS, ROUTINE W REFLEX MICROSCOPIC - Abnormal; Notable for the following components:   Color, Urine STRAW (*)    All other components within normal limits  I-STAT CG4 LACTIC ACID, ED - Abnormal; Notable for the following components:   Lactic Acid, Venous 2.72 (*)    All other components within normal limits  CULTURE, BLOOD (ROUTINE X 2)  CULTURE, BLOOD (ROUTINE X 2)  URINE CULTURE  SEDIMENTATION RATE  AMMONIA  LACTIC ACID, PLASMA  I-STAT CG4 LACTIC ACID, ED  I-STAT CG4 LACTIC ACID, ED  I-STAT CG4 LACTIC ACID, ED    EKG  EKG Interpretation  Date/Time:  Monday September 18 2017 08:32:40 EST Ventricular Rate:  99 PR Interval:    QRS Duration: 84 QT Interval:  338 QTC Calculation: 434 R Axis:   88 Text Interpretation:  Sinus rhythm Borderline right axis deviation Baseline wander in lead(s) V5 V6 Abnormal ekg Confirmed by Carmin Muskrat [phone]) on 09/18/2017 9:43:50 AM       Radiology Dg Chest 2 View  Result Date: 09/18/2017 CLINICAL DATA:  Fever. EXAM: CHEST  2 VIEW COMPARISON:  Radiographs of July 20, 2015. FINDINGS: The heart size and mediastinal contours are within normal limits. Both lungs are clear. No pneumothorax  or pleural effusion is noted. The visualized skeletal  structures are unremarkable. IMPRESSION: No active cardiopulmonary disease. Electronically Signed   By: Marijo Conception, M.D.   On: 09/18/2017 09:10   Ct Head Wo Contrast  Result Date: 09/18/2017 CLINICAL DATA:  Acute severe headache.  Worst headache of life. EXAM: CT HEAD WITHOUT CONTRAST TECHNIQUE: Contiguous axial images were obtained from the base of the skull through the vertex without intravenous contrast. COMPARISON:  Head CT 03/11/2015 FINDINGS: Brain: Similar chronic small vessel ischemia. 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 No intracranial hemorrhage, mass effect, or midline shift. No hydrocephalus. The basilar cisterns are patent. No evidence of territorial infarct or acute ischemia. No extra-axial or intracranial fluid collection. Vascular: No hyperdense vessel or unexpected calcification. Skull: No fracture or focal lesion. Sinuses/Orbits: Paranasal sinuses and mastoid air cells are clear. The visualized orbits are unremarkable. Other: None. IMPRESSION: 1.  No acute intracranial abnormality. 2. Unchanged mild chronic small vessel ischemia and remote bilateral basal gangliar lacunar infarcts. Electronically Signed   By: Jeb Levering M.D.   On: 09/18/2017 06:13   Mr Lumbar Spine Wo Contrast  Result Date: 09/18/2017 CLINICAL DATA:  Back pain with rapidly progressive neurological deficit. Previous lumbar surgery. EXAM: MRI LUMBAR SPINE WITHOUT CONTRAST TECHNIQUE: Multiplanar, multisequence MR imaging of the lumbar spine was performed. No intravenous contrast was administered. COMPARISON:  Radiographs dated 08/18/2014 and MRI dated 07/18/2014 FINDINGS: Segmentation:  Standard. Alignment:  2 mm spondylolisthesis at L4-5, new since 2015. Vertebrae:  No fracture, evidence of discitis, or bone lesion. Conus medullaris and cauda equina: Conus extends to the L1-2 level. Conus and cauda equina appear normal. Paraspinal and other  soft tissues: Negative. Disc levels: T12-L1 through L2-3:  Normal. L3-4: Normal disc. Congenitally short pedicles. No focal neural impingement. L4-5: There is a 21 x 11 x 8 mm soft disc extrusion extending superiorly on the right behind the body of L4 from the L4-5 disc space. This extrusion compresses the right side of the thecal sac and should affect the right L4 nerve. There is severe bilateral facet arthritis with a small broad-based bulge of the remainder of the disc. Hypertrophy of the facet joints narrows the transverse dimension of the spinal canal and compresses both lateral recesses, right greater than left best seen on image 36 of series 6. The patient has had posterior decompression at L4-5 and L5-S1. There is moderately severe right foraminal stenosis. L5-S1: Chronic disc space narrowing. Posterior decompression. No focal neural impingement. Moderate right foraminal stenosis. The nerve appears to exit without impingement however. IMPRESSION: 1. Prominent soft disc extrusion at L4-5 on the right extending superiorly behind the body of L4 almost to the level of the L3-4 disc. This compresses the right L4 nerve and the right side of the thecal sac. 2. Bilateral lateral recess impingement in transverse spinal stenosis at L4-5 due to hypertrophy of the facet joints and ligamentum flavum. 3. Moderately severe right foraminal stenosis at L4-5 and moderate right foraminal stenosis at L5-S1. Electronically Signed   By: Lorriane Shire M.D.   On: 09/18/2017 15:23    Procedures Procedures (including critical care time)  Medications Ordered in ED Medications  piperacillin-tazobactam (ZOSYN) IVPB 3.375 g (not administered)  vancomycin (VANCOCIN) IVPB 750 mg/150 ml premix (not administered)  sodium chloride 0.9 % bolus 1,000 mL (0 mLs Intravenous Stopped 09/18/17 1112)  ketorolac (TORADOL) 30 MG/ML injection 30 mg (30 mg Intravenous Given 09/18/17 1001)  acetaminophen (TYLENOL) tablet 1,000 mg (1,000 mg  Oral Given 09/18/17 1001)  prochlorperazine (COMPAZINE) injection 10  mg (10 mg Intravenous Given 09/18/17 1000)  diphenhydrAMINE (BENADRYL) injection 25 mg (25 mg Intravenous Given 09/18/17 1001)  piperacillin-tazobactam (ZOSYN) IVPB 3.375 g (0 g Intravenous Stopped 09/18/17 1027)  vancomycin (VANCOCIN) 1,500 mg in sodium chloride 0.9 % 500 mL IVPB (0 mg Intravenous Stopped 09/18/17 1250)  LORazepam (ATIVAN) injection 1 mg (1 mg Intravenous Given 09/18/17 1357)     Initial Impression / Assessment and Plan / ED Course  I have reviewed the triage vital signs and the nursing notes.  Pertinent labs & imaging results that were available during my care of the patient were reviewed by me and considered in my medical decision making (see chart for details).     Patient seems fairly confused and is febrile he does not have any meningismus at this time.  I will have the patient admitted to the hospitalist for further evaluation and care.  Final Clinical Impressions(s) / ED Diagnoses   Final diagnoses:  Disorientation    ED Discharge Orders    None       Rebeca Allegra 09/18/17 1658    Carmin Muskrat, MD 09/22/17 2118

## 2017-09-18 NOTE — ED Notes (Signed)
No answer

## 2017-09-18 NOTE — Progress Notes (Signed)
Pharmacy Antibiotic Note  Carlos Mccall is a 61 y.o. male admitted on 09/18/2017 with sepsis.  Pharmacy has been consulted for vancomycin and zosyn dosing. Tmax is 102 and WBC is elevated at 20. SCr is elevated at 1.72 and Lactic acid is >2.   Plan: Vancomycin 1500mg  IV x 1 then 750mg  IV Q12H Zosyn 3.375gm IV Q8H (4 hr inf) F/u renal fxn, C&S, clinical status and trough at SS  Height: 5\' 11"  (180.3 cm) Weight: 158 lb (71.7 kg) IBW/kg (Calculated) : 75.3  Temp (24hrs), Avg:100.3 F (37.9 C), Min:98.6 F (37 C), Max:102 F (38.9 C)  Recent Labs  Lab 09/18/17 0537 09/18/17 0845  WBC 20.0*  --   CREATININE 1.72*  --   LATICACIDVEN  --  2.72*    Estimated Creatinine Clearance: 45.2 mL/min (A) (by C-G formula based on SCr of 1.72 mg/dL (H)).    Allergies  Allergen Reactions  . Antihistamines, Chlorpheniramine-Type     Low tolerance, reports that it gives him migraines   . Quinine Derivatives Other (See Comments)    migraine  . Shellfish Allergy Other (See Comments)    migraine  . Tetracyclines & Related Nausea And Vomiting and Other (See Comments)    headache  . Thimerosal Other (See Comments)    Unknown reaction  . Codeine Rash and Other (See Comments)    Jitters, hallucinations  . Latex Rash and Other (See Comments)    Only if patient wears it himself, causes rash    Antimicrobials this admission: Vanc 12/17>> Zosyn 12/17>>  Dose adjustments this admission: N/A  Microbiology results: Pending  Thank you for allowing pharmacy to be a part of this patient's care.  Caylan Schifano, Rande Lawman 09/18/2017 9:01 AM

## 2017-09-18 NOTE — ED Triage Notes (Signed)
Pt c/o migraine "all over" onset 4 hours ago, states pain worse with this HA than normal, +nausea.  Pt started prednisone 2 days ago for back pain.

## 2017-09-18 NOTE — Consult Note (Signed)
Chief Complaint   Chief Complaint  Patient presents with  . Migraine    HPI   HPI: Carlos Mccall is a 61 y.o. male who presented to ER complaining of migraine. Long standing history of migraines. Per wife present at bedside, migraines are associated with aphasia and disorientation. He sees Neuro for trigger point injections for his migraines. Typically his wife can notice when the injections are wearing off because he will start to become disoriented.   MRI lumbar spine ordered due to chronic low back pain for osteomyelitis/disciitis r/o. Neurosurgery consulted due to MRI findings. Chronic low back pain x several decades. History of low back surgery (unsure type) in 1996 by Dr Joya Salm ([place] - retired). 3 weeks ago, pain increased in severity without known cause. Radiates down right leg with associated N/T. Intermittent weakness but still able to ambulate, no falls. No bowel/bladder dysfunction. Did go to PCP several days ago and was rx prednisone without relief.    Patient Active Problem List   Diagnosis Date Noted  . Sepsis ([place]) 09/18/2017  . Acute respiratory failure with hypoxia ([place]) 09/18/2017  . Acute metabolic encephalopathy 46/65/9935  . Chronic pain 09/18/2017  . Hyperlipidemia 09/18/2017  . Depression 09/18/2017  . Acute kidney injury ([place]) 09/18/2017  . Lumbar stenosis 07/30/2014  . Migraine with aura 06/19/2013  . ANXIETY 01/26/2009  . DEPRESSION 01/26/2009  . UNSPECIFIED PREGLAUCOMA 01/26/2009  . SYNCOPE AND COLLAPSE 01/26/2009  . DIZZINESS 01/26/2009  . HEADACHE 01/26/2009  . DYSPNEA 01/26/2009  . RHEUMATIC FEVER, HX OF 01/26/2009  . Latex allergy 12/25/2008    PMH: Past Medical History:  Diagnosis Date  . Allergy   . Anxiety    PANIC EPISODES  . Anxiety   . Arthritis    lumbar spine stenosis   . Chronic pain   . Complication of anesthesia    difficult time waking up, cannot tolerate getting nerve block- pain related  . Depression   . Fibromyalgia     CHRONIC FATIGUE SYNDROME  . GERD (gastroesophageal reflux disease)    uses peppermint on occasion   . Glaucoma    pre  . H/O echocardiogram    also had a tilt table test-showed orthostatic hypotension   . Head trauma    history of multiple  . Hyperlipidemia   . IBS (irritable bowel syndrome)   . Migraines    complex  . Migraines   . Neurocardiogenic syncope   . Orthostatic hypotension   . Post traumatic stress disorder (PTSD)   . Shortness of breath   . Spinal stenosis   . Tremors of nervous system     PSH: Past Surgical History:  Procedure Laterality Date  . BACK SURGERY  1996,07/30/2014  . CLOSED REDUCTION SHOULDER DISLOCATION    . EAR CYST EXCISION  08/09/2012   WRIST-R, NOT EARProcedure: CYST REMOVAL;  Surgeon: Cammie Sickle., MD;  Location: [place] [facility];  Service: Orthopedics;  Laterality: Right;  EXCISIONAL BIOPSY  . HAND NERVE REPAIR  1983  . LUMBAR LAMINECTOMY    . LUMBAR LAMINECTOMY/DECOMPRESSION MICRODISCECTOMY Bilateral 07/30/2014   Procedure: Bilateral Lumbar four-five Laminectomy/Foraminotomy;  Surgeon: Floyce Stakes, MD;  Location: [facility];  Service: Neurosurgery;  Laterality: Bilateral;  Bilateral Lumbar four-five Laminectomy/Foraminotomy  . ORIF RADIAL FRACTURE  08/09/2012   Procedure: OPEN REDUCTION INTERNAL FIXATION (ORIF) RADIAL FRACTURE;  Surgeon: Cammie Sickle., MD;  Location: [place];  Service: Orthopedics;  Laterality: Right;  . THUMB ARTHROSCOPY  both  . TONSILLECTOMY    . trigger thumb release       (Not in a hospital admission)  SH: Social History   Tobacco Use  . Smoking status: Never Smoker  . Smokeless tobacco: Never Used  Substance Use Topics  . Alcohol use: No  . Drug use: No    MEDS: Prior to Admission medications   Medication Sig Start Date End Date Taking? Authorizing Provider  baclofen (LIORESAL) 10 MG tablet Take 10 mg by mouth See admin instructions. Take 1 tablet (10  mg) by mouth at onset of migraine, up to two tablets daily and no more than two days in a week   Yes [provider]  Calcium-Magnesium-Vitamin D (SUPER CAL-MAG-D PO) Take 1 tablet by mouth 2 (two) times daily. Calcium 500 mg, Magnesium 250 mg, Vitamin D 400 units   Yes [provider]  clonazePAM (KLONOPIN) 2 MG tablet Take 1-5 mg by mouth See admin instructions. Take 1 tablet (2 mg) by mouth every morning, take 1/2 tablet (1 mg) late afternoon and 2 1/2 tablet (5 mg) at bedtime   Yes [provider]  EPINEPHrine (EPIPEN) 0.3 mg/0.3 mL SOAJ Inject 0.3 mg into the muscle once as needed (severe allergic reaction).    Yes [provider]  hydrOXYzine (ATARAX/VISTARIL) 25 MG tablet Take 25 mg by mouth at bedtime.  08/20/17  Yes [provider]  Menthol, Topical Analgesic, (BIOFREEZE EX) Apply 1 application topically 5 (five) times daily as needed (pain).   Yes [provider]  metoCLOPramide (REGLAN) 10 MG tablet TAKE 1 TABLET BY MOUTH 3 TIMES A DAY AS NEEDED Patient taking differently: TAKE 1 TABLET (10 MG) BY MOUTH THREE TIMES DAILY FOR NAUSEA  ASSOCIATED WITH MIGRAINES 04/15/15  Yes Penumalli, Earlean Polka, MD  Pantothenic Acid 500 MG TABS Take 500 mg by mouth 3 (three) times daily.    Yes [provider]  potassium gluconate 595 MG TABS tablet Take 595 mg by mouth 3 (three) times daily.    Yes [provider]  predniSONE (DELTASONE) 10 MG tablet Take 10-40 mg by mouth See admin instructions. Tapered course started 09/16/17 - take 4 tablets (40 mg) by mouth daily for 2 days, then take 3 tablets (30 mg) daily for 2 days, then take 2 tablets (20 mg) daily for 2 days, then take 1 tablet (10 mg) daily for 2 days, then stop. 09/15/17  Yes [provider]  sertraline (ZOLOFT) 25 MG tablet Take 50 mg by mouth at bedtime.  05/31/13  Yes [provider]  venlafaxine (EFFEXOR-XR) 150 MG 24 hr capsule Take 150 mg by mouth daily.      Yes [provider]  zonisamide (ZONEGRAN) 25 MG capsule Take 25-50 mg by mouth See admin instructions. Take 1 capsule (25 mg) by mouth every morning and 2 capsules (50 mg) at night - for migraine prevention   Yes [provider]  eletriptan (RELPAX) 40 MG tablet Take 1 tablet (40 mg total) by mouth as needed for migraine (Do not exceed 2 per day or 4 per week). Patient not taking: Reported on 09/18/2017 05/24/13   Penumalli, Earlean Polka, MD  rizatriptan (MAXALT-MLT) 10 MG disintegrating tablet TAKE 1 TABLET (10 MG TOTAL) BY MOUTH AS NEEDED FOR MIGRAINE. MAY REPEAT IN 2 HOURS IF NEEDED Patient not taking: Reported on 09/18/2017 11/14/14   Penni Bombard, MD    ALLERGY: Allergies  Allergen Reactions  . Antihistamines, Chlorpheniramine-Type  Low tolerance, reports that it gives him migraines   . Quinine Derivatives Other (See Comments)    migraine  . Shellfish Allergy Other (See Comments)    migraine  . Tetracyclines & Related Nausea And Vomiting and Other (See Comments)    headache  . Thimerosal Other (See Comments)    Unknown reaction  . Codeine Rash and Other (See Comments)    Jitters, hallucinations  . Latex Rash and Other (See Comments)    Only if patient wears it himself, causes rash    Social History   Tobacco Use  . Smoking status: Never Smoker  . Smokeless tobacco: Never Used  Substance Use Topics  . Alcohol use: No     Family History  Adopted: Yes     ROS   Review of Systems  Constitutional: Negative.   HENT: Negative.   Eyes: Negative.   Respiratory: Negative.   Cardiovascular: Negative.   Gastrointestinal: Negative.   Genitourinary: Negative.   Musculoskeletal: Positive for back pain and myalgias. Negative for falls, joint pain and neck pain.  Skin: Negative.   Neurological: Positive for tingling (RLE), speech change (aphasic) and headaches (migraine - feels similiar). Negative for tremors, sensory change, focal weakness, seizures and  loss of consciousness.   Exam   Vitals:   09/18/17 1315 09/18/17 1335  BP: 105/89 122/64  Pulse: (!) 101 (!) 109  Resp:  17  Temp:    SpO2: (!) 77% 92%   General appearance: WDWN, NAD Eyes: PERRL, Fundoscopic: normal Cardiovascular: Regular rate and rhythm without murmurs, rubs, gallops. No edema or variciosities. Distal pulses normal. Pulmonary: Clear to auscultation Musculoskeletal:     Muscle tone upper extremities: Normal    Muscle tone lower extremities: Normal    Motor exam: Upper Extremities Deltoid Bicep Tricep Grip  Right 5/5 5/5 5/5 5/5  Left 5/5 5/5 5/5 5/5   Lower Extremity IP Quad PF DF EHL  Right 5/5 5/5 5/5 5/5 5/5  Left 5/5 5/5 5/5 5/5 5/5   Neurological Awake, alert Oriented to self and location although wife says this is first time he got it correct Aphasic. Difficulties with naming and following commands intermittently. CNII: Visual fields normal CNIII/IV/VI: EOMI CNV: Facial sensation normal CNVII: Symmetric, normal strength CNVIII: Grossly normal CNIX: Normal palate movement CNXI: Trap and SCM strength normal CN XII: Tongue protrusion normal Sensation grossly intact to LT DTR: Normal  Results - Imaging/Labs   Results for orders placed or performed during the hospital encounter of 09/18/17 (from the past 48 hour(s))  CBC with Differential/Platelet     Status: Abnormal   Collection Time: 09/18/17  5:37 AM  Result Value Ref Range   WBC 20.0 (H) 4.0 - 10.5 K/uL   RBC 4.72 4.22 - 5.81 MIL/uL   Hemoglobin 14.5 13.0 - 17.0 g/dL   HCT 39.5 39.0 - 52.0 %   MCV 83.7 78.0 - 100.0 fL   MCH 30.7 26.0 - 34.0 pg   MCHC 36.7 (H) 30.0 - 36.0 g/dL   RDW 12.4 11.5 - 15.5 %   Platelets 190 150 - 400 K/uL   Neutrophils Relative % 87 %   Neutro Abs 17.3 (H) 1.7 - 7.7 K/uL   Lymphocytes Relative 5 %   Lymphs Abs 1.1 0.7 - 4.0 K/uL   Monocytes Relative 8 %   Monocytes Absolute 1.6 (H) 0.1 - 1.0 K/uL   Eosinophils Relative 0 %   Eosinophils Absolute 0.0  0.0 - 0.7 K/uL   Basophils Relative  0 %   Basophils Absolute 0.0 0.0 - 0.1 K/uL  Basic metabolic panel     Status: Abnormal   Collection Time: 09/18/17  5:37 AM  Result Value Ref Range   Sodium 131 (L) 135 - 145 mmol/L   Potassium 4.1 3.5 - 5.1 mmol/L   Chloride 97 (L) 101 - 111 mmol/L   CO2 21 (L) 22 - 32 mmol/L   Glucose, Bld 116 (H) 65 - 99 mg/dL   BUN 12 6 - 20 mg/dL   Creatinine, Ser 1.72 (H) 0.61 - 1.24 mg/dL   Calcium 10.0 8.9 - 10.3 mg/dL   GFR calc non Af Amer 41 (L) >60 mL/min   GFR calc Af Amer 47 (L) >60 mL/min    Comment: (NOTE) The eGFR has been calculated using the CKD EPI equation. This calculation has not been validated in all clinical situations. eGFR's persistently <60 mL/min signify possible Chronic Kidney Disease.    Anion gap 13 5 - 15  Sedimentation rate     Status: None   Collection Time: 09/18/17  5:37 AM  Result Value Ref Range   Sed Rate 1 0 - 16 mm/hr  I-Stat CG4 Lactic Acid, ED     Status: Abnormal   Collection Time: 09/18/17  8:45 AM  Result Value Ref Range   Lactic Acid, Venous 2.72 (HH) 0.5 - 1.9 mmol/L   Comment NOTIFIED PHYSICIAN   Ammonia     Status: None   Collection Time: 09/18/17 10:34 AM  Result Value Ref Range   Ammonia 30 9 - 35 umol/L  Urinalysis, Routine w reflex microscopic     Status: Abnormal   Collection Time: 09/18/17 12:43 PM  Result Value Ref Range   Color, Urine STRAW (A) YELLOW   APPearance CLEAR CLEAR   Specific Gravity, Urine 1.008 1.005 - 1.030   pH 8.0 5.0 - 8.0   Glucose, UA NEGATIVE NEGATIVE mg/dL   Hgb urine dipstick NEGATIVE NEGATIVE   Bilirubin Urine NEGATIVE NEGATIVE   Ketones, ur NEGATIVE NEGATIVE mg/dL   Protein, ur NEGATIVE NEGATIVE mg/dL   Nitrite NEGATIVE NEGATIVE   Leukocytes, UA NEGATIVE NEGATIVE  Lactic acid, plasma     Status: None   Collection Time: 09/18/17  3:33 PM  Result Value Ref Range   Lactic Acid, Venous 1.3 0.5 - 1.9 mmol/L  I-Stat arterial blood gas, ED     Status: Abnormal    Collection Time: 09/18/17  4:45 PM  Result Value Ref Range   pH, Arterial 7.404 7.350 - 7.450   pCO2 arterial 32.6 32.0 - 48.0 mmHg   pO2, Arterial 103.0 83.0 - 108.0 mmHg   Bicarbonate 20.0 20.0 - 28.0 mmol/L   TCO2 21 (L) 22 - 32 mmol/L   O2 Saturation 97.0 %   Acid-base deficit 3.0 (H) 0.0 - 2.0 mmol/L   Patient temperature 102.0 F    Collection site RADIAL, ALLEN'S TEST ACCEPTABLE    Drawn by RT    Sample type ARTERIAL     Dg Chest 2 View  Result Date: 09/18/2017 CLINICAL DATA:  Fever. EXAM: CHEST  2 VIEW COMPARISON:  Radiographs of July 20, 2015. FINDINGS: The heart size and mediastinal contours are within normal limits. Both lungs are clear. No pneumothorax or pleural effusion is noted. The visualized skeletal structures are unremarkable. IMPRESSION: No active cardiopulmonary disease. Electronically Signed   By: Marijo Conception, M.D.   On: 09/18/2017 09:10   Ct Head Wo Contrast  Result Date: 09/18/2017 CLINICAL DATA:  Acute severe headache.  Worst headache of life. EXAM: CT HEAD WITHOUT CONTRAST TECHNIQUE: Contiguous axial images were obtained from the base of the skull through the vertex without intravenous contrast. COMPARISON:  Head CT 03/11/2015 FINDINGS: Brain: Similar chronic small vessel ischemia. 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 No intracranial hemorrhage, mass effect, or midline shift. No hydrocephalus. The basilar cisterns are patent. No evidence of territorial infarct or acute ischemia. No extra-axial or intracranial fluid collection. Vascular: No hyperdense vessel or unexpected calcification. Skull: No fracture or focal lesion. Sinuses/Orbits: Paranasal sinuses and mastoid air cells are clear. The visualized orbits are unremarkable. Other: None. IMPRESSION: 1.  No acute intracranial abnormality. 2. Unchanged mild chronic small vessel ischemia and remote bilateral basal gangliar lacunar infarcts. Electronically Signed   By: Jeb Levering M.D.   On:  09/18/2017 06:13   Mr Lumbar Spine Wo Contrast  Result Date: 09/18/2017 CLINICAL DATA:  Back pain with rapidly progressive neurological deficit. Previous lumbar surgery. EXAM: MRI LUMBAR SPINE WITHOUT CONTRAST TECHNIQUE: Multiplanar, multisequence MR imaging of the lumbar spine was performed. No intravenous contrast was administered. COMPARISON:  Radiographs dated 08/18/2014 and MRI dated 07/18/2014 FINDINGS: Segmentation:  Standard. Alignment:  2 mm spondylolisthesis at L4-5, new since 2015. Vertebrae:  No fracture, evidence of discitis, or bone lesion. Conus medullaris and cauda equina: Conus extends to the L1-2 level. Conus and cauda equina appear normal. Paraspinal and other soft tissues: Negative. Disc levels: T12-L1 through L2-3:  Normal. L3-4: Normal disc. Congenitally short pedicles. No focal neural impingement. L4-5: There is a 21 x 11 x 8 mm soft disc extrusion extending superiorly on the right behind the body of L4 from the L4-5 disc space. This extrusion compresses the right side of the thecal sac and should affect the right L4 nerve. There is severe bilateral facet arthritis with a small broad-based bulge of the remainder of the disc. Hypertrophy of the facet joints narrows the transverse dimension of the spinal canal and compresses both lateral recesses, right greater than left best seen on image 36 of series 6. The patient has had posterior decompression at L4-5 and L5-S1. There is moderately severe right foraminal stenosis. L5-S1: Chronic disc space narrowing. Posterior decompression. No focal neural impingement. Moderate right foraminal stenosis. The nerve appears to exit without impingement however. IMPRESSION: 1. Prominent soft disc extrusion at L4-5 on the right extending superiorly behind the body of L4 almost to the level of the L3-4 disc. This compresses the right L4 nerve and the right side of the thecal sac. 2. Bilateral lateral recess impingement in transverse spinal stenosis at L4-5  due to hypertrophy of the facet joints and ligamentum flavum. 3. Moderately severe right foraminal stenosis at L4-5 and moderate right foraminal stenosis at L5-S1. Electronically Signed   By: Lorriane Shire M.D.   On: 09/18/2017 15:23    Impression/Plan   61 y.o. male who presented to ER due to migraine with disorientation. NS consulted due to acute on chronic low back pain with radiation down right leg. MRI shows L4-5 disc protrusion compressing L4 nerve and moderate-severe right foraminal stenosis at L4-5 and L5-S1.  He is neuro intact with the exception of aphasia which wife reports is normal when he has a migraine. While he does have lumbar pathology, there is no emergent NS intervention indicated. He can f/u outpatient. Med recs for symptomatic relief although caution due to disorientation. - Gabapentin 335m TID - Robaxin 7547mQID - Medrol dose pack

## 2017-09-18 NOTE — ED Notes (Signed)
Delay in lab draw pt not in room at this time 

## 2017-09-18 NOTE — Progress Notes (Signed)
Received report from Jennifer,RN in the ED. 

## 2017-09-18 NOTE — ED Notes (Signed)
Attempted to call report

## 2017-09-18 NOTE — ED Notes (Signed)
Heart Healthy Diet was ordered for Dinner. 

## 2017-09-18 NOTE — ED Notes (Signed)
Admitting provider paged about Lactic now

## 2017-09-18 NOTE — Progress Notes (Signed)
NURSING PROGRESS NOTE  Carlos Mccall [facility]: [id number] Admission Data: 09/18/17 at 2050 Attending Provider: Bonnell Public, MD PCP: Jani Gravel, MD Code status: Full  Allergies:  Allergies  Allergen Reactions  . Antihistamines, Chlorpheniramine-Type     Low tolerance, reports that it gives him migraines   . Quinine Derivatives Other (See Comments)    migraine  . Shellfish Allergy Other (See Comments)    migraine  . Tetracyclines & Related Nausea And Vomiting and Other (See Comments)    headache  . Thimerosal Other (See Comments)    Unknown reaction  . Codeine Rash and Other (See Comments)    Jitters, hallucinations  . Latex Rash and Other (See Comments)    Only if patient wears it himself, causes rash   Past Medical History:  Past Medical History:  Diagnosis Date  . Allergy   . Anxiety    PANIC EPISODES  . Anxiety   . Arthritis    lumbar spine stenosis   . Chronic pain   . Complication of anesthesia    difficult time waking up, cannot tolerate getting nerve block- pain related  . Depression   . Fibromyalgia    CHRONIC FATIGUE SYNDROME  . GERD (gastroesophageal reflux disease)    uses peppermint on occasion   . Glaucoma    pre  . H/O echocardiogram    also had a tilt table test-showed orthostatic hypotension   . Head trauma    history of multiple  . Hyperlipidemia   . IBS (irritable bowel syndrome)   . Migraines    complex  . Migraines   . Neurocardiogenic syncope   . Orthostatic hypotension   . Post traumatic stress disorder (PTSD)   . Shortness of breath   . Spinal stenosis   . Tremors of nervous system    Past Surgical History:  Past Surgical History:  Procedure Laterality Date  . BACK SURGERY  1996,07/30/2014  . CLOSED REDUCTION SHOULDER DISLOCATION    . EAR CYST EXCISION  08/09/2012   WRIST-R, NOT EARProcedure: CYST REMOVAL;  Surgeon: Cammie Sickle., MD;  Location: [place];  Service: Orthopedics;  Laterality:  Right;  EXCISIONAL BIOPSY  . HAND NERVE REPAIR  1983  . LUMBAR LAMINECTOMY    . LUMBAR LAMINECTOMY/DECOMPRESSION MICRODISCECTOMY Bilateral 07/30/2014   Procedure: Bilateral Lumbar four-five Laminectomy/Foraminotomy;  Surgeon: Floyce Stakes, MD;  Location: [facility];  Service: Neurosurgery;  Laterality: Bilateral;  Bilateral Lumbar four-five Laminectomy/Foraminotomy  . ORIF RADIAL FRACTURE  08/09/2012   Procedure: OPEN REDUCTION INTERNAL FIXATION (ORIF) RADIAL FRACTURE;  Surgeon: Cammie Sickle., MD;  Location: [place];  Service: Orthopedics;  Laterality: Right;  . THUMB ARTHROSCOPY     both  . TONSILLECTOMY    . TRIGGER FINGER RELEASE     thumb  [name] is a 61 y.o. male patient, arrived to floor in room 5W10 via stretcher, transferred from ED. Patient alert and oriented X 3-4. No acute distress noted. Denies pain.   Vital signs: Oral temperature 98.2 F (36.8 C), Blood pressure 131/65, Pulse 73, RR 20, SpO2 99 % on room air.    Cardiac monitoring: Telemetry box 5W #29 in place. Second verified by Toyin O.,RN  IV access: Left AC-infusing; condition patent and no redness.  Skin: intact, no pressure ulcer noted in sacral area. Second verified by Margaretmary Bayley., RN  Patient's ID armband verified with patient/ family, and in place. Information packet given to patient/ family. Fall  risk assessed, SR up X2, patient/ family able to verbalize understanding of risks associated with falls and to call nurse or staff to assist before getting out of bed. Patient/ family oriented to room and equipment. Call bell within reach.

## 2017-09-18 NOTE — ED Notes (Signed)
Unable to locate the pt 

## 2017-09-19 LAB — RESPIRATORY PANEL BY PCR
ADENOVIRUS-RVPPCR: NOT DETECTED
BORDETELLA PERTUSSIS-RVPCR: NOT DETECTED
CORONAVIRUS 229E-RVPPCR: NOT DETECTED
CORONAVIRUS HKU1-RVPPCR: NOT DETECTED
CORONAVIRUS NL63-RVPPCR: NOT DETECTED
Chlamydophila pneumoniae: NOT DETECTED
Coronavirus OC43: NOT DETECTED
INFLUENZA A-RVPPCR: NOT DETECTED
Influenza B: NOT DETECTED
METAPNEUMOVIRUS-RVPPCR: NOT DETECTED
Mycoplasma pneumoniae: NOT DETECTED
PARAINFLUENZA VIRUS 2-RVPPCR: NOT DETECTED
PARAINFLUENZA VIRUS 3-RVPPCR: NOT DETECTED
Parainfluenza Virus 1: NOT DETECTED
Parainfluenza Virus 4: NOT DETECTED
RHINOVIRUS / ENTEROVIRUS - RVPPCR: NOT DETECTED
Respiratory Syncytial Virus: NOT DETECTED

## 2017-09-19 LAB — URINE CULTURE: Culture: NO GROWTH

## 2017-09-19 LAB — HIV ANTIBODY (ROUTINE TESTING W REFLEX): HIV Screen 4th Generation wRfx: NONREACTIVE

## 2017-09-19 MED ORDER — PREDNISOLONE 5 MG PO TABS: 10.0000 mg | ORAL_TABLET | Freq: Every day | ORAL | Status: DC

## 2017-09-19 MED ORDER — PREDNISONE 20 MG PO TABS
20.0000 mg | ORAL_TABLET | Freq: Every day | ORAL | Status: DC
  Administered 2017-09-20: 20 mg via ORAL
  Filled 2017-09-19: qty 1

## 2017-09-19 MED ORDER — PREDNISONE 10 MG PO TABS
30.0000 mg | ORAL_TABLET | Freq: Once | ORAL | Status: AC
Start: 2017-09-19 — End: 2017-09-19
  Administered 2017-09-19: 30 mg via ORAL
  Filled 2017-09-19: qty 3

## 2017-09-19 NOTE — Evaluation (Signed)
Physical Therapy Evaluation Patient Details Name: Carlos Mccall MRN: [id number] DOB: 1956-06-12 Today's Date: 09/19/2017   History of Present Illness  Pt is a 61 y/o male admitted secondary to AMS and headache. Pt with migraines at baseline. CT was negative for acute abnormality, and MRI revealed soft disk extrusion at L4-5 and stenosis at L4-S1. PMH includes anxiety, fibromyaliga, chronic pain, migraines, glaucoma, PTSD, spinal stenosis, and s/p lumbar surgery, and s/p ORIF of radial fracture.   Clinical Impression  Pt admitted secondary to problem above with deficits below. PTA, pt was independent with functional mobility. Upon eval, pt presenting with headache, R shin pain, decreased sensation in R toes (baseline), and decreased balance. Pt required min to min guard assist with dynamic tasks during gait secondary to unsteadiness. With normal gait, pt overall steady. Educated about outpatient PT recommendations to increase balance, and pt and wife would like to discuss before deciding. Will continue to follow acutely to maximize functional mobility independence and safety.     Follow Up Recommendations Outpatient PT;Supervision for mobility/OOB    Equipment Recommendations  None recommended by PT    Recommendations for Other Services       Precautions / Restrictions Precautions Precautions: Fall Precaution Comments: Reviewed back precautions with pt as pt reports back pain.  Restrictions Weight Bearing Restrictions: No      Mobility  Bed Mobility Overal bed mobility: Needs Assistance Bed Mobility: Supine to Sit;Sit to Supine     Supine to sit: Supervision Sit to supine: Supervision   General bed mobility comments: Supervision for safety.   Transfers Overall transfer level: Needs assistance Equipment used: None Transfers: Sit to/from Stand Sit to Stand: Supervision         General transfer comment: Supervision for safety.   Ambulation/Gait Ambulation/Gait  assistance: Min assist;Min guard Ambulation Distance (Feet): 200 Feet Assistive device: None Gait Pattern/deviations: Step-through pattern;Decreased stride length;Drifts right/left Gait velocity: Decreased Gait velocity interpretation: Below normal speed for age/gender General Gait Details: Slow, unsteady gait with dynamic balance tasks. Otherwise steady with gait on level surfaces. Min to min guard assist for LOB with dynamic tasks.   Stairs            Wheelchair Mobility    Modified Rankin (Stroke Patients Only)       Balance Overall balance assessment: Needs assistance Sitting-balance support: No upper extremity supported;Feet supported Sitting balance-Leahy Scale: Good     Standing balance support: During functional activity;No upper extremity supported Standing balance-Leahy Scale: Fair                   Standardized Balance Assessment Standardized Balance Assessment : Dynamic Gait Index   Dynamic Gait Index Level Surface: Normal Change in Gait Speed: Mild Impairment Gait with Horizontal Head Turns: Mild Impairment Gait with Vertical Head Turns: Moderate Impairment Step Over Obstacle: Mild Impairment Step Around Obstacles: Moderate Impairment       Pertinent Vitals/Pain Pain Assessment: 0-10 Pain Score: 5  Pain Location: head, L side of face, R shin  Pain Descriptors / Indicators: Headache;Aching Pain Intervention(s): Limited activity within patient's tolerance;Monitored during session;Repositioned    Home Living Family/patient expects to be discharged to:: Private residence Living Arrangements: Spouse/significant other Available Help at Discharge: Family;Available 24 hours/day Type of Home: House Home Access: Stairs to enter Entrance Stairs-Rails: Left;Right;Can reach both Entrance Stairs-Number of Steps: 12(6 and then 6) Home Layout: One level Home Equipment: Crutches      Prior Function Level of Independence: Independent  Hand Dominance   Dominant Hand: Right    Extremity/Trunk Assessment   Upper Extremity Assessment Upper Extremity Assessment: Overall WFL for tasks assessed    Lower Extremity Assessment Lower Extremity Assessment: RLE deficits/detail RLE Deficits / Details: Pain at shin and numbness in toes at baseline.     Cervical / Trunk Assessment Cervical / Trunk Assessment: Other exceptions Cervical / Trunk Exceptions: PMH of back surgery  Communication   Communication: No difficulties  Cognition Arousal/Alertness: Awake/alert Behavior During Therapy: WFL for tasks assessed/performed Overall Cognitive Status: Within Functional Limits for tasks assessed                                        General Comments General comments (skin integrity, edema, etc.): Pt's wife present during session. Educated about outpatient PT recommendations, and pt would like to discuss with wife before accepting.     Exercises     Assessment/Plan    PT Assessment Patient needs continued PT services  PT Problem List Decreased balance;Decreased mobility;Decreased knowledge of use of DME;Decreased knowledge of precautions;Pain;Impaired sensation       PT Treatment Interventions Gait training;Stair training;Functional mobility training;Therapeutic activities;Therapeutic exercise;Balance training;Neuromuscular re-education;Patient/family education    PT Goals (Current goals can be found in the Care Plan section)  Acute Rehab PT Goals Patient Stated Goal: to feel better  PT Goal Formulation: With patient Time For Goal Achievement: 10/03/17 Potential to Achieve Goals: Good    Frequency Min 3X/week   Barriers to discharge        Co-evaluation               AM-PAC PT "6 Clicks" Daily Activity  Outcome Measure Difficulty turning over in bed (including adjusting bedclothes, sheets and blankets)?: None Difficulty moving from lying on back to sitting on the side of the bed? :  None Difficulty sitting down on and standing up from a chair with arms (e.g., wheelchair, bedside commode, etc,.)?: Unable Help needed moving to and from a bed to chair (including a wheelchair)?: A Little Help needed walking in hospital room?: A Little Help needed climbing 3-5 steps with a railing? : A Little 6 Click Score: 18    End of Session   Activity Tolerance: Patient tolerated treatment well Patient left: in bed;with call bell/phone within reach;with bed alarm set;with family/visitor present Nurse Communication: Mobility status PT Visit Diagnosis: Unsteadiness on feet (R26.81);Pain Pain - Right/Left: Right Pain - part of body: (shin and on L side of head )    Time: 1250-1313 PT Time Calculation (min) (ACUTE ONLY): 23 min   Charges:   PT Evaluation $PT Eval Low Complexity: 1 Low PT Treatments $Gait Training: 8-22 mins   PT G Codes:        Leighton Ruff, PT, DPT  Acute Rehabilitation Services  Pager: [phone]   Rudean Hitt 09/19/2017, 1:24 PM

## 2017-09-19 NOTE — Consult Note (Signed)
          [facility] CM Primary Care Navigator  09/19/2017  Carlos Mccall 27-Mar-1956 [id number]   Met with patientat the bedside to identify possible discharge needs. Patientreports having worsening pain to back radiating down to right leg,"migraine headache with aura and confusion"thathad led to this admission.  Patient endorsesDr. Jani Gravel with [facility] as [facility] primary care provider.   [facility] [address] [facility].  Patient manages his medicationsat home with assistance from wife (Carlos Mccall)-straight out of the containers.  Patient reports that he was driving prior to admission but wife is able to providetransportation to hisdoctors'appointments when needed.  Patient's wife is his primary caregiver at home as stated.   Anticipated discharge plan ishome with outpatient therapy per PT recommendation.  Patient voiced understanding to call primary care provider's officewhen he returns homefor a post discharge follow-up appointment within 1-2 weeks or sooner if needs arise.Patient letter (with PCP's contact number) wasprovided as a reminder.  Explained to patient regarding [facility] CM services available for health management at home buthe communicated no further needs or concerns at this point. Patient was encouraged and he voiced understandingof need to seek referral from primary care provider to [facility] care management ifnecessaryand deemed appropriate for services in the future.  [facility] care management information provided for future needs thathe may have.  Patient had opted and verbally agreed forEMMI calls tofollow-up with recoveryat home.   Referral made for [facility] General calls after discharge.   For additional questions please contact:  Edwena Felty A. Addyson Traub, BSN, RN-BC [facility] PRIMARY CARE Navigator Cell: [phone]

## 2017-09-19 NOTE — Progress Notes (Signed)
PROGRESS NOTE    Carlos Mccall  BBC:[id number] DOB: 06/18/1956 DOA: 09/18/2017 PCP: Jani Gravel, MD   Specialists:     Brief Narrative:  61 y/o WM Chr complic migraines Back surgery since 1997 Bipolar hld Admit with met encephalopathy in settign inability to ge tmigrain meds and fever 102 HTn urgency and intermit hypoxia to 86% CAR nef MRI Lumbar=extrsuion l4-l5 and compression R L5   Assessment & Plan:   Principal Problem:   Sepsis ([place]) Active Problems:   Acute respiratory failure with hypoxia (HCC)   Acute metabolic encephalopathy   Chronic pain   Hyperlipidemia   Depression   Acute kidney injury ([place])   Lumbosacral radiculopathy at L4   Fever-unknown cause-cont Vanc/Zosyn-Follow Bc x 2 12/17-US neg-if neg would d/c abx and d/c home L4-l5 radiculopathy and Chr LBP-was on medrol dose pac--could acc for leukocytosis.  Monitor symptoms for resolution-will need OP neurology and NS input-previously on Baclofen 10? Acute hypoxia on admit-resolved-uncear cause AKI-baseline month ago was creat 1.19-->1.7 on admit. Hydrating with NS 100 cc/h.  Labs in am Migraines-unable to get Relpax 40 and Maxalt MLT--will need OP follow up-can resume Regaln 10 rid and see if helpes Bipolar-cont effexor 150 q 24    DVT prophylaxis: lovenox Code Status:  Full  Family Communication: called but did not rech on phone Disposition Plan:  Home am if no growth on cult   Consultants:   NS  Procedures:   none  Antimicrobials:   vacn  Zosyn    Subjective: Feels fair some confusion but overall seems to be able to answer some q's No cp No le edema No cp no n/v eating and drinking  Objective: Vitals:   09/18/17 2052 09/19/17 0300 09/19/17 0540 09/19/17 1339  BP: 131/65  113/64 130/62  Pulse: 73  64 64  Resp: 20  18   Temp: 98.2 F (36.8 C) 97.9 F (36.6 C) 97.7 F (36.5 C) 97.7 F (36.5 C)  TempSrc: Oral Oral Oral Oral  SpO2: 99%  99% 100%  Weight: 70.6 kg (155 lb  10.3 oz)     Height: 5' 7.5" (1.715 m)       Intake/Output Summary (Last 24 hours) at 09/19/2017 1402 Last data filed at 09/19/2017 1100 Gross per 24 hour  Intake 1661.67 ml  Output 1600 ml  Net 61.67 ml   Filed Weights   09/18/17 0316 09/18/17 2052  Weight: 71.7 kg (158 lb) 70.6 kg (155 lb 10.3 oz)    Examination:    Data Reviewed: I have personally reviewed following labs and imaging studies  CBC: Recent Labs  Lab 09/18/17 0537  WBC 20.0*  NEUTROABS 17.3*  HGB 14.5  HCT 39.5  MCV 83.7  PLT 503   Basic Metabolic Panel: Recent Labs  Lab 09/18/17 0537  NA 131*  K 4.1  CL 97*  CO2 21*  GLUCOSE 116*  BUN 12  CREATININE 1.72*  CALCIUM 10.0   GFR: Estimated Creatinine Clearance: 42.4 mL/min (A) (by C-G formula based on SCr of 1.72 mg/dL (H)). Liver Function Tests: No results for input(s): AST, ALT, ALKPHOS, BILITOT, PROT, ALBUMIN in the last 168 hours. No results for input(s): LIPASE, AMYLASE in the last 168 hours. Recent Labs  Lab 09/18/17 1034  AMMONIA 30   Coagulation Profile: Recent Labs  Lab 09/18/17 2105  INR 1.13   Cardiac Enzymes: No results for input(s): CKTOTAL, CKMB, CKMBINDEX, TROPONINI in the last 168 hours. BNP (last 3 results) No results for input(s): PROBNP  in the last 8760 hours. HbA1C: No results for input(s): HGBA1C in the last 72 hours. CBG: No results for input(s): GLUCAP in the last 168 hours. Lipid Profile: No results for input(s): CHOL, HDL, LDLCALC, TRIG, CHOLHDL, LDLDIRECT in the last 72 hours. Thyroid Function Tests: No results for input(s): TSH, T4TOTAL, FREET4, T3FREE, THYROIDAB in the last 72 hours. Anemia Panel: No results for input(s): VITAMINB12, FOLATE, FERRITIN, TIBC, IRON, RETICCTPCT in the last 72 hours. Urine analysis:    Component Value Date/Time   COLORURINE STRAW (A) 09/18/2017 1243   APPEARANCEUR CLEAR 09/18/2017 1243   LABSPEC 1.008 09/18/2017 1243   PHURINE 8.0 09/18/2017 1243   GLUCOSEU NEGATIVE  09/18/2017 1243   HGBUR NEGATIVE 09/18/2017 1243   BILIRUBINUR NEGATIVE 09/18/2017 1243   KETONESUR NEGATIVE 09/18/2017 1243   PROTEINUR NEGATIVE 09/18/2017 1243   NITRITE NEGATIVE 09/18/2017 1243   LEUKOCYTESUR NEGATIVE 09/18/2017 1243     Radiology Studies: Reviewed images personally in health database    Scheduled Meds: . clonazePAM  1 mg Oral TID  . enoxaparin (LOVENOX) injection  40 mg Subcutaneous Q24H  . sertraline  50 mg Oral QHS  . venlafaxine XR  150 mg Oral Daily  . zonisamide  25 mg Oral Daily  . zonisamide  50 mg Oral QHS   Continuous Infusions: . sodium chloride 100 mL/hr at 09/18/17 1821  . piperacillin-tazobactam (ZOSYN)  IV Stopped (09/19/17 1250)  . vancomycin Stopped (09/19/17 0950)     LOS: 1 day    Time spent: [place], MD [facility]   If 7PM-7AM, please contact night-coverage [email or web address] Password [email or web address] 09/19/2017, 2:02 PM

## 2017-09-20 LAB — BASIC METABOLIC PANEL
Anion gap: 9 (ref 5–15)
BUN: 14 mg/dL (ref 6–20)
CO2: 16 mmol/L — ABNORMAL LOW (ref 22–32)
CREATININE: 1.67 mg/dL — AB (ref 0.61–1.24)
Calcium: 8.4 mg/dL — ABNORMAL LOW (ref 8.9–10.3)
Chloride: 105 mmol/L (ref 101–111)
GFR calc Af Amer: 49 mL/min — ABNORMAL LOW (ref >60)
GFR calc non Af Amer: 42 mL/min — ABNORMAL LOW (ref >60)
GLUCOSE: 102 mg/dL — AB (ref 65–99)
POTASSIUM: 3.6 mmol/L (ref 3.5–5.1)
Sodium: 130 mmol/L — ABNORMAL LOW (ref 135–145)

## 2017-09-20 MED ORDER — PREDNISONE 10 MG (21) PO TBPK: ORAL_TABLET | ORAL | 0 refills | Status: AC

## 2017-09-20 NOTE — Progress Notes (Addendum)
Discharge instructions reviewed with pt and wife by RN Ginger earlier.   Copy of instructions and scripts given to pt. Pt had labs drawn, Dr Verlon Au notified when labs resulted, MD spoke with pt/pt's wife via phone about results.  Pt d/c'd via wheelchair with belongings, with wife.          Escorted by unit NT.

## 2017-09-20 NOTE — Discharge Summary (Addendum)
Physician Discharge Summary  Carlos Mccall XNA:[id number] DOB: August 23, 1956 DOA: 09/18/2017  PCP: Jani Gravel, MD  Admit date: 09/18/2017 Discharge date: 09/20/2017  Time spent: 25 minutes  Recommendations for Outpatient Follow-up:  1. Patient will need to continue stera-pred Dosepak and complete the same as an outpatient and then follow-up with neurologist and possibly neurosurgeon for options regarding lower back pain and radicular pain  Discharge Diagnoses:  Principal Problem:   Sepsis ([place]) Active Problems:   Acute respiratory failure with hypoxia ([place])   Acute metabolic encephalopathy   Chronic pain   Hyperlipidemia   Depression   Acute kidney injury ([place])   Lumbosacral radiculopathy at L4   Discharge Condition: Improved  Diet recommendation: Heart healthy low-salt  Filed Weights   09/18/17 0316 09/18/17 2052  Weight: 71.7 kg (158 lb) 70.6 kg (155 lb 10.3 oz)    History of present illness:  61 y/o WM Chr complic migraines Back surgery since 1997 Bipolar hld Admit with met encephalopathy in settign inability to get migraine meds and fever 102 HTn urgency and intermit hypoxia to 86% CAR nef MRI Lumbar=extrsuion l4-l5 and compression R L5    Hospital Course:  Fever-unknown cause-cont Vanc/Zosyn-Follow Bc x 2 12/17-US neg-initial cultures -12/19-DC antibiotics and sending home L4-l5 radiculopathy and Chr LBP-was on medrol dose pac--could acc for leukocytosis.  Monitor symptoms for resolution-will need OP neurology and NS input Acute hypoxia on admit-resolved-uncear cause AKI-baseline 2 years ago was creat 1.19-->1.7 on admit. Hydrating with NS 100 cc/h.  Labs in am Complicated migraines with hallucinations-unable to get Relpax 40 and Maxalt MLT--will need OP follow up-can resume Regaln 10 tid, resuming baclofen as well as Zomig on discharge as these are meds that are affordable to him--he has had hallucinations in the past and realizes that they are not rail-he is not  seeing any hallucinations now and I feel he is stable for discharge Bipolar-cont effexor 150 q 24 and clonazepam    Procedures:  MRI of lower back   Consultations:  Neurosurgery  Discharge Exam: Vitals:   09/19/17 2111 09/20/17 0528  BP: (!) 147/66 (!) 141/73  Pulse: 64 66  Resp: 18 16  Temp: 98 F (36.7 C) 97.9 F (36.6 C)  SpO2: 100% 98%    General: Awake alert reports some hallucinations earlier today with seeing flowers and then seen torso of a man with lightning strikes-he realizes that these are unusual and are not real and endorses that he is able to tell the difference between what really is and what is not-he does not hear any voices and has no ideations Cardiovascular: S1-S2 no murmur rub or gallop Respiratory: Clinically clear no added sound Neurologically intact able to left leg above bed without issue  Discharge Instructions    Allergies as of 09/20/2017      Reactions   Antihistamines, Chlorpheniramine-type    Low tolerance, reports that it gives him migraines    Quinine Derivatives Other (See Comments)   migraine   Shellfish Allergy Other (See Comments)   migraine   Tetracyclines & Related Nausea And Vomiting, Other (See Comments)   headache   Thimerosal Other (See Comments)   Unknown reaction   Codeine Rash, Other (See Comments)   Jitters, hallucinations   Latex Rash, Other (See Comments)   Only if patient wears it himself, causes rash      Medication List    STOP taking these medications   BIOFREEZE EX   eletriptan 40 MG tablet Commonly known as:  RELPAX   predniSONE 10 MG tablet Commonly known as:  DELTASONE Replaced by:  predniSONE 10 MG (21) Tbpk tablet   rizatriptan 10 MG disintegrating tablet Commonly known as:  MAXALT-MLT     TAKE these medications   baclofen 10 MG tablet Commonly known as:  LIORESAL Take 10 mg by mouth See admin instructions. Take 1 tablet (10 mg) by mouth at onset of migraine, up to two tablets daily and  no more than two days in a week   clonazePAM 2 MG tablet Commonly known as:  KLONOPIN Take 1-5 mg by mouth See admin instructions. Take 1 tablet (2 mg) by mouth every morning, take 1/2 tablet (1 mg) late afternoon and 2 1/2 tablet (5 mg) at bedtime   EPIPEN 0.3 mg/0.3 mL Soaj injection Generic drug:  EPINEPHrine Inject 0.3 mg into the muscle once as needed (severe allergic reaction).   hydrOXYzine 25 MG tablet Commonly known as:  ATARAX/VISTARIL Take 25 mg by mouth at bedtime.   metoCLOPramide 10 MG tablet Commonly known as:  REGLAN TAKE 1 TABLET BY MOUTH 3 TIMES A DAY AS NEEDED What changed:    how much to take  how to take this  when to take this   Pantothenic Acid 500 MG Tabs Take 500 mg by mouth 3 (three) times daily.   potassium gluconate 595 (99 K) MG Tabs tablet Take 595 mg by mouth 3 (three) times daily.   predniSONE 10 MG (21) Tbpk tablet Commonly known as:  STERAPRED UNI-PAK 21 TAB Take 4 tablets for 2 days, then 3 tablets for 2 days, then 1 tablet daily until all gone Replaces:  predniSONE 10 MG tablet   sertraline 25 MG tablet Commonly known as:  ZOLOFT Take 50 mg by mouth at bedtime.   SUPER CAL-MAG-D PO Take 1 tablet by mouth 2 (two) times daily. Calcium 500 mg, Magnesium 250 mg, Vitamin D 400 units   venlafaxine XR 150 MG 24 hr capsule Commonly known as:  EFFEXOR-XR Take 150 mg by mouth daily.   zonisamide 25 MG capsule Commonly known as:  ZONEGRAN Take 25-50 mg by mouth See admin instructions. Take 1 capsule (25 mg) by mouth every morning and 2 capsules (50 mg) at night - for migraine prevention      Allergies  Allergen Reactions  . Antihistamines, Chlorpheniramine-Type     Low tolerance, reports that it gives him migraines   . Quinine Derivatives Other (See Comments)    migraine  . Shellfish Allergy Other (See Comments)    migraine  . Tetracyclines & Related Nausea And Vomiting and Other (See Comments)    headache  . Thimerosal Other  (See Comments)    Unknown reaction  . Codeine Rash and Other (See Comments)    Jitters, hallucinations  . Latex Rash and Other (See Comments)    Only if patient wears it himself, causes rash      The results of significant diagnostics from this hospitalization (including imaging, microbiology, ancillary and laboratory) are listed below for reference.    Significant Diagnostic Studies: Dg Chest 2 View  Result Date: 09/18/2017 CLINICAL DATA:  Fever. EXAM: CHEST  2 VIEW COMPARISON:  Radiographs of July 20, 2015. FINDINGS: The heart size and mediastinal contours are within normal limits. Both lungs are clear. No pneumothorax or pleural effusion is noted. The visualized skeletal structures are unremarkable. IMPRESSION: No active cardiopulmonary disease. Electronically Signed   By: Marijo Conception, M.D.   On: 09/18/2017 09:10  Ct Head Wo Contrast  Result Date: 09/18/2017 CLINICAL DATA:  Acute severe headache.  Worst headache of life. EXAM: CT HEAD WITHOUT CONTRAST TECHNIQUE: Contiguous axial images were obtained from the base of the skull through the vertex without intravenous contrast. COMPARISON:  Head CT 03/11/2015 FINDINGS: Brain: Similar chronic small vessel ischemia. 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 No intracranial hemorrhage, mass effect, or midline shift. No hydrocephalus. The basilar cisterns are patent. No evidence of territorial infarct or acute ischemia. No extra-axial or intracranial fluid collection. Vascular: No hyperdense vessel or unexpected calcification. Skull: No fracture or focal lesion. Sinuses/Orbits: Paranasal sinuses and mastoid air cells are clear. The visualized orbits are unremarkable. Other: None. IMPRESSION: 1.  No acute intracranial abnormality. 2. Unchanged mild chronic small vessel ischemia and remote bilateral basal gangliar lacunar infarcts. Electronically Signed   By: Jeb Levering M.D.   On: 09/18/2017 06:13   Mr Lumbar Spine Wo  Contrast  Result Date: 09/18/2017 CLINICAL DATA:  Back pain with rapidly progressive neurological deficit. Previous lumbar surgery. EXAM: MRI LUMBAR SPINE WITHOUT CONTRAST TECHNIQUE: Multiplanar, multisequence MR imaging of the lumbar spine was performed. No intravenous contrast was administered. COMPARISON:  Radiographs dated 08/18/2014 and MRI dated 07/18/2014 FINDINGS: Segmentation:  Standard. Alignment:  2 mm spondylolisthesis at L4-5, new since 2015. Vertebrae:  No fracture, evidence of discitis, or bone lesion. Conus medullaris and cauda equina: Conus extends to the L1-2 level. Conus and cauda equina appear normal. Paraspinal and other soft tissues: Negative. Disc levels: T12-L1 through L2-3:  Normal. L3-4: Normal disc. Congenitally short pedicles. No focal neural impingement. L4-5: There is a 21 x 11 x 8 mm soft disc extrusion extending superiorly on the right behind the body of L4 from the L4-5 disc space. This extrusion compresses the right side of the thecal sac and should affect the right L4 nerve. There is severe bilateral facet arthritis with a small broad-based bulge of the remainder of the disc. Hypertrophy of the facet joints narrows the transverse dimension of the spinal canal and compresses both lateral recesses, right greater than left best seen on image 36 of series 6. The patient has had posterior decompression at L4-5 and L5-S1. There is moderately severe right foraminal stenosis. L5-S1: Chronic disc space narrowing. Posterior decompression. No focal neural impingement. Moderate right foraminal stenosis. The nerve appears to exit without impingement however. IMPRESSION: 1. Prominent soft disc extrusion at L4-5 on the right extending superiorly behind the body of L4 almost to the level of the L3-4 disc. This compresses the right L4 nerve and the right side of the thecal sac. 2. Bilateral lateral recess impingement in transverse spinal stenosis at L4-5 due to hypertrophy of the facet joints  and ligamentum flavum. 3. Moderately severe right foraminal stenosis at L4-5 and moderate right foraminal stenosis at L5-S1. Electronically Signed   By: Lorriane Shire M.D.   On: 09/18/2017 15:23    Microbiology: Recent Results (from the past 240 hour(s))  Blood Culture (routine x 2)     Status: None (Preliminary result)   Collection Time: 09/18/17  8:23 AM  Result Value Ref Range Status   Specimen Description BLOOD RIGHT WRIST  Final   Special Requests IN PEDIATRIC BOTTLE Blood Culture adequate volume  Final   Culture NO GROWTH 1 DAY  Final   Report Status PENDING  Incomplete  Blood Culture (routine x 2)     Status: None (Preliminary result)   Collection Time: 09/18/17  8:29 AM  Result Value Ref  Range Status   Specimen Description BLOOD LEFT HAND  Final   Special Requests   Final    BOTTLES DRAWN AEROBIC AND ANAEROBIC Blood Culture adequate volume   Culture NO GROWTH 1 DAY  Final   Report Status PENDING  Incomplete  Urine culture     Status: None   Collection Time: 09/18/17 12:43 PM  Result Value Ref Range Status   Specimen Description URINE, CLEAN CATCH  Final   Special Requests NONE  Final   Culture NO GROWTH  Final   Report Status 09/19/2017 FINAL  Final  Respiratory Panel by PCR     Status: None   Collection Time: 09/18/17  4:15 PM  Result Value Ref Range Status   Adenovirus NOT DETECTED NOT DETECTED Final   Coronavirus 229E NOT DETECTED NOT DETECTED Final   Coronavirus HKU1 NOT DETECTED NOT DETECTED Final   Coronavirus NL63 NOT DETECTED NOT DETECTED Final   Coronavirus OC43 NOT DETECTED NOT DETECTED Final   Metapneumovirus NOT DETECTED NOT DETECTED Final   Rhinovirus / Enterovirus NOT DETECTED NOT DETECTED Final   Influenza A NOT DETECTED NOT DETECTED Final   Influenza B NOT DETECTED NOT DETECTED Final   Parainfluenza Virus 1 NOT DETECTED NOT DETECTED Final   Parainfluenza Virus 2 NOT DETECTED NOT DETECTED Final   Parainfluenza Virus 3 NOT DETECTED NOT DETECTED Final    Parainfluenza Virus 4 NOT DETECTED NOT DETECTED Final   Respiratory Syncytial Virus NOT DETECTED NOT DETECTED Final   Bordetella pertussis NOT DETECTED NOT DETECTED Final   Chlamydophila pneumoniae NOT DETECTED NOT DETECTED Final   Mycoplasma pneumoniae NOT DETECTED NOT DETECTED Final     Labs: Basic Metabolic Panel: Recent Labs  Lab 09/18/17 0537  NA 131*  K 4.1  CL 97*  CO2 21*  GLUCOSE 116*  BUN 12  CREATININE 1.72*  CALCIUM 10.0   Liver Function Tests: No results for input(s): AST, ALT, ALKPHOS, BILITOT, PROT, ALBUMIN in the last 168 hours. No results for input(s): LIPASE, AMYLASE in the last 168 hours. Recent Labs  Lab 09/18/17 1034  AMMONIA 30   CBC: Recent Labs  Lab 09/18/17 0537  WBC 20.0*  NEUTROABS 17.3*  HGB 14.5  HCT 39.5  MCV 83.7  PLT 190   Cardiac Enzymes: No results for input(s): CKTOTAL, CKMB, CKMBINDEX, TROPONINI in the last 168 hours. BNP: BNP (last 3 results) No results for input(s): BNP in the last 8760 hours.  ProBNP (last 3 results) No results for input(s): PROBNP in the last 8760 hours.  CBG: No results for input(s): GLUCAP in the last 168 hours.     Signed:  Nita Sells MD   [facility] 09/20/2017, 10:25 AM

## 2017-09-20 NOTE — Progress Notes (Signed)
Pt's wife requesting renal function be rechecked to see if creat had improved before being discharged. Last done on 12/17 day of admit with creat 1.72, BUN 12.   Wife trying to eliminate another trip to see another MD she states. Dr Verlon Au notified, he will place an order for a BMET. Pt and wife notified.

## 2017-09-23 LAB — CULTURE, BLOOD (ROUTINE X 2)
Culture: NO GROWTH
Culture: NO GROWTH
Special Requests: ADEQUATE
Special Requests: ADEQUATE

## 2018-10-16 DIAGNOSIS — M791 Myalgia, unspecified site: Secondary | ICD-10-CM | POA: Diagnosis not present

## 2018-10-16 DIAGNOSIS — M542 Cervicalgia: Secondary | ICD-10-CM | POA: Diagnosis not present

## 2018-10-16 DIAGNOSIS — G43111 Migraine with aura, intractable, with status migrainosus: Secondary | ICD-10-CM | POA: Diagnosis not present

## 2018-10-16 DIAGNOSIS — G518 Other disorders of facial nerve: Secondary | ICD-10-CM | POA: Diagnosis not present

## 2018-10-16 DIAGNOSIS — G43719 Chronic migraine without aura, intractable, without status migrainosus: Secondary | ICD-10-CM | POA: Diagnosis not present

## 2018-11-12 DIAGNOSIS — G518 Other disorders of facial nerve: Secondary | ICD-10-CM | POA: Diagnosis not present

## 2018-11-12 DIAGNOSIS — G43111 Migraine with aura, intractable, with status migrainosus: Secondary | ICD-10-CM | POA: Diagnosis not present

## 2018-11-12 DIAGNOSIS — M791 Myalgia, unspecified site: Secondary | ICD-10-CM | POA: Diagnosis not present

## 2018-11-12 DIAGNOSIS — M542 Cervicalgia: Secondary | ICD-10-CM | POA: Diagnosis not present

## 2018-11-12 DIAGNOSIS — G43719 Chronic migraine without aura, intractable, without status migrainosus: Secondary | ICD-10-CM | POA: Diagnosis not present

## 2018-12-25 DIAGNOSIS — M791 Myalgia, unspecified site: Secondary | ICD-10-CM | POA: Diagnosis not present

## 2018-12-25 DIAGNOSIS — G43719 Chronic migraine without aura, intractable, without status migrainosus: Secondary | ICD-10-CM | POA: Diagnosis not present

## 2018-12-25 DIAGNOSIS — M542 Cervicalgia: Secondary | ICD-10-CM | POA: Diagnosis not present

## 2018-12-25 DIAGNOSIS — G43111 Migraine with aura, intractable, with status migrainosus: Secondary | ICD-10-CM | POA: Diagnosis not present

## 2018-12-25 DIAGNOSIS — G518 Other disorders of facial nerve: Secondary | ICD-10-CM | POA: Diagnosis not present

## 2019-02-06 DIAGNOSIS — G43111 Migraine with aura, intractable, with status migrainosus: Secondary | ICD-10-CM | POA: Diagnosis not present

## 2019-02-06 DIAGNOSIS — G518 Other disorders of facial nerve: Secondary | ICD-10-CM | POA: Diagnosis not present

## 2019-02-06 DIAGNOSIS — M791 Myalgia, unspecified site: Secondary | ICD-10-CM | POA: Diagnosis not present

## 2019-02-06 DIAGNOSIS — G43719 Chronic migraine without aura, intractable, without status migrainosus: Secondary | ICD-10-CM | POA: Diagnosis not present

## 2019-02-06 DIAGNOSIS — M542 Cervicalgia: Secondary | ICD-10-CM | POA: Diagnosis not present

## 2019-03-21 DIAGNOSIS — G43111 Migraine with aura, intractable, with status migrainosus: Secondary | ICD-10-CM | POA: Diagnosis not present

## 2019-03-21 DIAGNOSIS — M542 Cervicalgia: Secondary | ICD-10-CM | POA: Diagnosis not present

## 2019-03-21 DIAGNOSIS — G43719 Chronic migraine without aura, intractable, without status migrainosus: Secondary | ICD-10-CM | POA: Diagnosis not present

## 2019-03-21 DIAGNOSIS — M791 Myalgia, unspecified site: Secondary | ICD-10-CM | POA: Diagnosis not present

## 2019-03-21 DIAGNOSIS — G518 Other disorders of facial nerve: Secondary | ICD-10-CM | POA: Diagnosis not present

## 2019-05-02 DIAGNOSIS — M791 Myalgia, unspecified site: Secondary | ICD-10-CM | POA: Diagnosis not present

## 2019-05-02 DIAGNOSIS — G43111 Migraine with aura, intractable, with status migrainosus: Secondary | ICD-10-CM | POA: Diagnosis not present

## 2019-05-02 DIAGNOSIS — M542 Cervicalgia: Secondary | ICD-10-CM | POA: Diagnosis not present

## 2019-05-02 DIAGNOSIS — G43719 Chronic migraine without aura, intractable, without status migrainosus: Secondary | ICD-10-CM | POA: Diagnosis not present

## 2019-05-02 DIAGNOSIS — G518 Other disorders of facial nerve: Secondary | ICD-10-CM | POA: Diagnosis not present

## 2019-06-14 DIAGNOSIS — G43719 Chronic migraine without aura, intractable, without status migrainosus: Secondary | ICD-10-CM | POA: Diagnosis not present

## 2019-06-14 DIAGNOSIS — G518 Other disorders of facial nerve: Secondary | ICD-10-CM | POA: Diagnosis not present

## 2019-06-14 DIAGNOSIS — M791 Myalgia, unspecified site: Secondary | ICD-10-CM | POA: Diagnosis not present

## 2019-06-14 DIAGNOSIS — M542 Cervicalgia: Secondary | ICD-10-CM | POA: Diagnosis not present

## 2019-06-14 DIAGNOSIS — G43111 Migraine with aura, intractable, with status migrainosus: Secondary | ICD-10-CM | POA: Diagnosis not present

## 2019-06-28 DIAGNOSIS — G43719 Chronic migraine without aura, intractable, without status migrainosus: Secondary | ICD-10-CM | POA: Diagnosis not present

## 2019-06-28 DIAGNOSIS — G43111 Migraine with aura, intractable, with status migrainosus: Secondary | ICD-10-CM | POA: Diagnosis not present

## 2019-07-11 ENCOUNTER — Other Ambulatory Visit: Payer: Self-pay

## 2019-07-11 DIAGNOSIS — Z20822 Contact with and (suspected) exposure to covid-19: Secondary | ICD-10-CM

## 2019-07-12 LAB — NOVEL CORONAVIRUS, NAA: SARS-CoV-2, NAA: NOT DETECTED

## 2019-07-24 DIAGNOSIS — M791 Myalgia, unspecified site: Secondary | ICD-10-CM | POA: Diagnosis not present

## 2019-07-24 DIAGNOSIS — M542 Cervicalgia: Secondary | ICD-10-CM | POA: Diagnosis not present

## 2019-07-24 DIAGNOSIS — G43111 Migraine with aura, intractable, with status migrainosus: Secondary | ICD-10-CM | POA: Diagnosis not present

## 2019-07-24 DIAGNOSIS — G43719 Chronic migraine without aura, intractable, without status migrainosus: Secondary | ICD-10-CM | POA: Diagnosis not present

## 2019-08-05 DIAGNOSIS — L738 Other specified follicular disorders: Secondary | ICD-10-CM | POA: Diagnosis not present

## 2019-08-05 DIAGNOSIS — L821 Other seborrheic keratosis: Secondary | ICD-10-CM | POA: Diagnosis not present

## 2019-08-05 DIAGNOSIS — L570 Actinic keratosis: Secondary | ICD-10-CM | POA: Diagnosis not present

## 2019-09-04 DIAGNOSIS — G43111 Migraine with aura, intractable, with status migrainosus: Secondary | ICD-10-CM | POA: Diagnosis not present

## 2019-09-04 DIAGNOSIS — M791 Myalgia, unspecified site: Secondary | ICD-10-CM | POA: Diagnosis not present

## 2019-09-04 DIAGNOSIS — G43719 Chronic migraine without aura, intractable, without status migrainosus: Secondary | ICD-10-CM | POA: Diagnosis not present

## 2019-09-04 DIAGNOSIS — G518 Other disorders of facial nerve: Secondary | ICD-10-CM | POA: Diagnosis not present

## 2019-09-04 DIAGNOSIS — M542 Cervicalgia: Secondary | ICD-10-CM | POA: Diagnosis not present

## 2019-10-01 DIAGNOSIS — H40013 Open angle with borderline findings, low risk, bilateral: Secondary | ICD-10-CM | POA: Diagnosis not present

## 2019-10-01 DIAGNOSIS — H25813 Combined forms of age-related cataract, bilateral: Secondary | ICD-10-CM | POA: Diagnosis not present

## 2019-10-01 DIAGNOSIS — G43809 Other migraine, not intractable, without status migrainosus: Secondary | ICD-10-CM | POA: Diagnosis not present

## 2019-10-16 DIAGNOSIS — G43111 Migraine with aura, intractable, with status migrainosus: Secondary | ICD-10-CM | POA: Diagnosis not present

## 2019-10-16 DIAGNOSIS — M542 Cervicalgia: Secondary | ICD-10-CM | POA: Diagnosis not present

## 2019-10-16 DIAGNOSIS — M791 Myalgia, unspecified site: Secondary | ICD-10-CM | POA: Diagnosis not present

## 2019-10-16 DIAGNOSIS — G43719 Chronic migraine without aura, intractable, without status migrainosus: Secondary | ICD-10-CM | POA: Diagnosis not present

## 2019-11-04 DIAGNOSIS — L821 Other seborrheic keratosis: Secondary | ICD-10-CM | POA: Diagnosis not present

## 2019-11-04 DIAGNOSIS — L738 Other specified follicular disorders: Secondary | ICD-10-CM | POA: Diagnosis not present

## 2019-11-25 DIAGNOSIS — G43719 Chronic migraine without aura, intractable, without status migrainosus: Secondary | ICD-10-CM | POA: Diagnosis not present

## 2019-11-25 DIAGNOSIS — M791 Myalgia, unspecified site: Secondary | ICD-10-CM | POA: Diagnosis not present

## 2019-11-25 DIAGNOSIS — M542 Cervicalgia: Secondary | ICD-10-CM | POA: Diagnosis not present

## 2019-11-25 DIAGNOSIS — G43111 Migraine with aura, intractable, with status migrainosus: Secondary | ICD-10-CM | POA: Diagnosis not present

## 2019-12-02 ENCOUNTER — Ambulatory Visit: Payer: Medicare Other | Attending: Internal Medicine

## 2019-12-02 DIAGNOSIS — Z23 Encounter for immunization: Secondary | ICD-10-CM | POA: Insufficient documentation

## 2019-12-02 NOTE — Progress Notes (Signed)
   Covid-19 Vaccination Clinic  Name:  Carlos Mccall    MRN: [id number] DOB: [birth year]  12/02/2019  Carlos Mccall was observed post Covid-19 immunization for 15 minutes without incidence. He was provided with Vaccine Information Sheet and instruction to access the V-Safe system.   Carlos Mccall was instructed to call 911 with any severe reactions post vaccine: Marland Kitchen Difficulty breathing  . Swelling of your face and throat  . A fast heartbeat  . A bad rash all over your body  . Dizziness and weakness    Immunizations Administered    Name Date Dose VIS Date Route   Pfizer COVID-19 Vaccine 12/02/2019 12:07 PM 0.3 mL 09/13/2019 Intramuscular   Manufacturer: [place]   Lot: [id number]   [place]: [id number]

## 2019-12-31 ENCOUNTER — Ambulatory Visit: Payer: Medicare Other | Attending: Internal Medicine

## 2019-12-31 DIAGNOSIS — Z23 Encounter for immunization: Secondary | ICD-10-CM

## 2019-12-31 NOTE — Progress Notes (Signed)
   Covid-19 Vaccination Clinic  Name:  Carlos Mccall    MRN: [id number] DOB: Aug 20, 1956  12/31/2019  [name] was observed post Covid-19 immunization for 30 minutes based on pre-vaccination screening without incident. He was provided with Vaccine Information Sheet and instruction to access the V-Safe system.   [name] was instructed to call 911 with any severe reactions post vaccine: Marland Kitchen Difficulty breathing  . Swelling of face and throat  . A fast heartbeat  . A bad rash all over body  . Dizziness and weakness   Immunizations Administered    Name Date Dose VIS Date Route   Pfizer COVID-19 Vaccine 12/31/2019  1:16 PM 0.3 mL 09/13/2019 Intramuscular   Manufacturer: [place]   Lot: [id number]   [place]: [id number]

## 2020-01-02 DIAGNOSIS — M542 Cervicalgia: Secondary | ICD-10-CM | POA: Diagnosis not present

## 2020-01-02 DIAGNOSIS — G43111 Migraine with aura, intractable, with status migrainosus: Secondary | ICD-10-CM | POA: Diagnosis not present

## 2020-01-02 DIAGNOSIS — G43719 Chronic migraine without aura, intractable, without status migrainosus: Secondary | ICD-10-CM | POA: Diagnosis not present

## 2020-01-02 DIAGNOSIS — M791 Myalgia, unspecified site: Secondary | ICD-10-CM | POA: Diagnosis not present

## 2020-01-22 ENCOUNTER — Encounter (HOSPITAL_COMMUNITY): Payer: Self-pay

## 2020-01-22 ENCOUNTER — Emergency Department (HOSPITAL_COMMUNITY): Payer: Medicare Other

## 2020-01-22 ENCOUNTER — Other Ambulatory Visit: Payer: Self-pay

## 2020-01-22 ENCOUNTER — Emergency Department (HOSPITAL_COMMUNITY)
Admission: EM | Admit: 2020-01-22 | Discharge: 2020-01-23 | Disposition: A | Discharge status: Home / Self Care | Payer: Medicare Other | Attending: Emergency Medicine | Admitting: Emergency Medicine

## 2020-01-22 DIAGNOSIS — S069X9A Unspecified intracranial injury with loss of consciousness of unspecified duration, initial encounter: Secondary | ICD-10-CM | POA: Diagnosis not present

## 2020-01-22 DIAGNOSIS — Z5321 Procedure and treatment not carried out due to patient leaving prior to being seen by health care provider: Secondary | ICD-10-CM | POA: Diagnosis not present

## 2020-01-22 DIAGNOSIS — M542 Cervicalgia: Secondary | ICD-10-CM | POA: Insufficient documentation

## 2020-01-22 DIAGNOSIS — Y92007 Garden or yard of unspecified non-institutional (private) residence as the place of occurrence of the external cause: Secondary | ICD-10-CM | POA: Diagnosis not present

## 2020-01-22 DIAGNOSIS — Y999 Unspecified external cause status: Secondary | ICD-10-CM | POA: Insufficient documentation

## 2020-01-22 DIAGNOSIS — S0993XA Unspecified injury of face, initial encounter: Secondary | ICD-10-CM | POA: Diagnosis not present

## 2020-01-22 DIAGNOSIS — Y93H2 Activity, gardening and landscaping: Secondary | ICD-10-CM | POA: Insufficient documentation

## 2020-01-22 DIAGNOSIS — W010XXA Fall on same level from slipping, tripping and stumbling without subsequent striking against object, initial encounter: Secondary | ICD-10-CM | POA: Insufficient documentation

## 2020-01-22 DIAGNOSIS — S01511A Laceration without foreign body of lip, initial encounter: Secondary | ICD-10-CM | POA: Diagnosis not present

## 2020-01-22 NOTE — ED Triage Notes (Signed)
Pt reports that he was cutting some branches, tripped on a pot and fell face first, abrasions and deformity to nose, lac to upper lip, chipped teeth, unknown LOC, c/o of neck pain, placed in collar in triage, not on thinners.

## 2020-01-23 NOTE — ED Notes (Signed)
Pt came to this tech stating he was leaving. Pt was encouraged to stay, pt left

## 2020-02-06 DIAGNOSIS — G43111 Migraine with aura, intractable, with status migrainosus: Secondary | ICD-10-CM | POA: Diagnosis not present

## 2020-02-06 DIAGNOSIS — M791 Myalgia, unspecified site: Secondary | ICD-10-CM | POA: Diagnosis not present

## 2020-02-06 DIAGNOSIS — G43719 Chronic migraine without aura, intractable, without status migrainosus: Secondary | ICD-10-CM | POA: Diagnosis not present

## 2020-02-06 DIAGNOSIS — M542 Cervicalgia: Secondary | ICD-10-CM | POA: Diagnosis not present

## 2020-03-23 DIAGNOSIS — M791 Myalgia, unspecified site: Secondary | ICD-10-CM | POA: Diagnosis not present

## 2020-03-23 DIAGNOSIS — M542 Cervicalgia: Secondary | ICD-10-CM | POA: Diagnosis not present

## 2020-03-23 DIAGNOSIS — G43111 Migraine with aura, intractable, with status migrainosus: Secondary | ICD-10-CM | POA: Diagnosis not present

## 2020-03-23 DIAGNOSIS — G43719 Chronic migraine without aura, intractable, without status migrainosus: Secondary | ICD-10-CM | POA: Diagnosis not present

## 2020-05-05 DIAGNOSIS — M542 Cervicalgia: Secondary | ICD-10-CM | POA: Diagnosis not present

## 2020-05-05 DIAGNOSIS — G43719 Chronic migraine without aura, intractable, without status migrainosus: Secondary | ICD-10-CM | POA: Diagnosis not present

## 2020-05-05 DIAGNOSIS — G43111 Migraine with aura, intractable, with status migrainosus: Secondary | ICD-10-CM | POA: Diagnosis not present

## 2020-05-05 DIAGNOSIS — M791 Myalgia, unspecified site: Secondary | ICD-10-CM | POA: Diagnosis not present

## 2020-06-16 DIAGNOSIS — G43719 Chronic migraine without aura, intractable, without status migrainosus: Secondary | ICD-10-CM | POA: Diagnosis not present

## 2020-06-16 DIAGNOSIS — G43111 Migraine with aura, intractable, with status migrainosus: Secondary | ICD-10-CM | POA: Diagnosis not present

## 2020-06-16 DIAGNOSIS — M542 Cervicalgia: Secondary | ICD-10-CM | POA: Diagnosis not present

## 2020-06-16 DIAGNOSIS — M791 Myalgia, unspecified site: Secondary | ICD-10-CM | POA: Diagnosis not present

## 2020-07-28 DIAGNOSIS — G43111 Migraine with aura, intractable, with status migrainosus: Secondary | ICD-10-CM | POA: Diagnosis not present

## 2020-07-28 DIAGNOSIS — G43719 Chronic migraine without aura, intractable, without status migrainosus: Secondary | ICD-10-CM | POA: Diagnosis not present

## 2020-07-28 DIAGNOSIS — M542 Cervicalgia: Secondary | ICD-10-CM | POA: Diagnosis not present

## 2020-07-28 DIAGNOSIS — M791 Myalgia, unspecified site: Secondary | ICD-10-CM | POA: Diagnosis not present

## 2020-08-10 DIAGNOSIS — J342 Deviated nasal septum: Secondary | ICD-10-CM | POA: Diagnosis not present

## 2020-08-10 DIAGNOSIS — H903 Sensorineural hearing loss, bilateral: Secondary | ICD-10-CM | POA: Diagnosis not present

## 2020-08-10 DIAGNOSIS — J343 Hypertrophy of nasal turbinates: Secondary | ICD-10-CM | POA: Diagnosis not present

## 2020-08-10 DIAGNOSIS — R490 Dysphonia: Secondary | ICD-10-CM | POA: Diagnosis not present

## 2020-08-10 DIAGNOSIS — H9313 Tinnitus, bilateral: Secondary | ICD-10-CM | POA: Diagnosis not present

## 2020-09-03 DIAGNOSIS — M542 Cervicalgia: Secondary | ICD-10-CM | POA: Diagnosis not present

## 2020-09-03 DIAGNOSIS — G43111 Migraine with aura, intractable, with status migrainosus: Secondary | ICD-10-CM | POA: Diagnosis not present

## 2020-09-03 DIAGNOSIS — M791 Myalgia, unspecified site: Secondary | ICD-10-CM | POA: Diagnosis not present

## 2020-09-03 DIAGNOSIS — G43719 Chronic migraine without aura, intractable, without status migrainosus: Secondary | ICD-10-CM | POA: Diagnosis not present

## 2020-10-07 DIAGNOSIS — G43719 Chronic migraine without aura, intractable, without status migrainosus: Secondary | ICD-10-CM | POA: Diagnosis not present

## 2020-10-07 DIAGNOSIS — G518 Other disorders of facial nerve: Secondary | ICD-10-CM | POA: Diagnosis not present

## 2020-10-07 DIAGNOSIS — M542 Cervicalgia: Secondary | ICD-10-CM | POA: Diagnosis not present

## 2020-10-07 DIAGNOSIS — M791 Myalgia, unspecified site: Secondary | ICD-10-CM | POA: Diagnosis not present

## 2020-10-07 DIAGNOSIS — G43111 Migraine with aura, intractable, with status migrainosus: Secondary | ICD-10-CM | POA: Diagnosis not present

## 2020-11-05 DIAGNOSIS — G518 Other disorders of facial nerve: Secondary | ICD-10-CM | POA: Diagnosis not present

## 2020-11-05 DIAGNOSIS — G43719 Chronic migraine without aura, intractable, without status migrainosus: Secondary | ICD-10-CM | POA: Diagnosis not present

## 2020-11-05 DIAGNOSIS — G43111 Migraine with aura, intractable, with status migrainosus: Secondary | ICD-10-CM | POA: Diagnosis not present

## 2020-11-05 DIAGNOSIS — M542 Cervicalgia: Secondary | ICD-10-CM | POA: Diagnosis not present

## 2020-11-05 DIAGNOSIS — M791 Myalgia, unspecified site: Secondary | ICD-10-CM | POA: Diagnosis not present

## 2020-12-08 DIAGNOSIS — G43719 Chronic migraine without aura, intractable, without status migrainosus: Secondary | ICD-10-CM | POA: Diagnosis not present

## 2020-12-08 DIAGNOSIS — G518 Other disorders of facial nerve: Secondary | ICD-10-CM | POA: Diagnosis not present

## 2020-12-08 DIAGNOSIS — G43111 Migraine with aura, intractable, with status migrainosus: Secondary | ICD-10-CM | POA: Diagnosis not present

## 2020-12-08 DIAGNOSIS — M791 Myalgia, unspecified site: Secondary | ICD-10-CM | POA: Diagnosis not present

## 2020-12-08 DIAGNOSIS — M542 Cervicalgia: Secondary | ICD-10-CM | POA: Diagnosis not present

## 2020-12-29 DIAGNOSIS — D485 Neoplasm of uncertain behavior of skin: Secondary | ICD-10-CM | POA: Diagnosis not present

## 2020-12-29 DIAGNOSIS — L570 Actinic keratosis: Secondary | ICD-10-CM | POA: Diagnosis not present

## 2020-12-29 DIAGNOSIS — L821 Other seborrheic keratosis: Secondary | ICD-10-CM | POA: Diagnosis not present

## 2020-12-29 DIAGNOSIS — L308 Other specified dermatitis: Secondary | ICD-10-CM | POA: Diagnosis not present

## 2021-01-07 DIAGNOSIS — G43719 Chronic migraine without aura, intractable, without status migrainosus: Secondary | ICD-10-CM | POA: Diagnosis not present

## 2021-01-07 DIAGNOSIS — M791 Myalgia, unspecified site: Secondary | ICD-10-CM | POA: Diagnosis not present

## 2021-01-07 DIAGNOSIS — M542 Cervicalgia: Secondary | ICD-10-CM | POA: Diagnosis not present

## 2021-01-07 DIAGNOSIS — G518 Other disorders of facial nerve: Secondary | ICD-10-CM | POA: Diagnosis not present

## 2021-01-07 DIAGNOSIS — G43111 Migraine with aura, intractable, with status migrainosus: Secondary | ICD-10-CM | POA: Diagnosis not present

## 2021-02-04 DIAGNOSIS — G43111 Migraine with aura, intractable, with status migrainosus: Secondary | ICD-10-CM | POA: Diagnosis not present

## 2021-02-04 DIAGNOSIS — M791 Myalgia, unspecified site: Secondary | ICD-10-CM | POA: Diagnosis not present

## 2021-02-04 DIAGNOSIS — G518 Other disorders of facial nerve: Secondary | ICD-10-CM | POA: Diagnosis not present

## 2021-02-04 DIAGNOSIS — G43719 Chronic migraine without aura, intractable, without status migrainosus: Secondary | ICD-10-CM | POA: Diagnosis not present

## 2021-02-04 DIAGNOSIS — M542 Cervicalgia: Secondary | ICD-10-CM | POA: Diagnosis not present

## 2021-02-12 DIAGNOSIS — H40013 Open angle with borderline findings, low risk, bilateral: Secondary | ICD-10-CM | POA: Diagnosis not present

## 2021-02-12 DIAGNOSIS — G43809 Other migraine, not intractable, without status migrainosus: Secondary | ICD-10-CM | POA: Diagnosis not present

## 2021-02-12 DIAGNOSIS — H5211 Myopia, right eye: Secondary | ICD-10-CM | POA: Diagnosis not present

## 2021-02-12 DIAGNOSIS — H52221 Regular astigmatism, right eye: Secondary | ICD-10-CM | POA: Diagnosis not present

## 2021-02-12 DIAGNOSIS — H25813 Combined forms of age-related cataract, bilateral: Secondary | ICD-10-CM | POA: Diagnosis not present

## 2021-03-18 DIAGNOSIS — G43109 Migraine with aura, not intractable, without status migrainosus: Secondary | ICD-10-CM | POA: Diagnosis not present

## 2021-03-18 DIAGNOSIS — M791 Myalgia, unspecified site: Secondary | ICD-10-CM | POA: Diagnosis not present

## 2021-03-18 DIAGNOSIS — G518 Other disorders of facial nerve: Secondary | ICD-10-CM | POA: Diagnosis not present

## 2021-03-18 DIAGNOSIS — G43111 Migraine with aura, intractable, with status migrainosus: Secondary | ICD-10-CM | POA: Diagnosis not present

## 2021-03-18 DIAGNOSIS — G43719 Chronic migraine without aura, intractable, without status migrainosus: Secondary | ICD-10-CM | POA: Diagnosis not present

## 2021-03-18 DIAGNOSIS — M542 Cervicalgia: Secondary | ICD-10-CM | POA: Diagnosis not present

## 2021-03-19 ENCOUNTER — Other Ambulatory Visit: Payer: Self-pay | Admitting: Specialist

## 2021-03-19 DIAGNOSIS — G43109 Migraine with aura, not intractable, without status migrainosus: Secondary | ICD-10-CM

## 2021-04-13 ENCOUNTER — Other Ambulatory Visit: Payer: Medicare Other

## 2021-04-18 ENCOUNTER — Ambulatory Visit
Admission: RE | Admit: 2021-04-18 | Discharge: 2021-04-18 | Disposition: A | Discharge status: Home / Self Care | Payer: Medicare HMO | Source: Ambulatory Visit | Attending: Specialist | Admitting: Specialist

## 2021-04-18 DIAGNOSIS — G43109 Migraine with aura, not intractable, without status migrainosus: Secondary | ICD-10-CM

## 2021-04-18 DIAGNOSIS — G43909 Migraine, unspecified, not intractable, without status migrainosus: Secondary | ICD-10-CM | POA: Diagnosis not present

## 2021-04-20 DIAGNOSIS — G43719 Chronic migraine without aura, intractable, without status migrainosus: Secondary | ICD-10-CM | POA: Diagnosis not present

## 2021-04-20 DIAGNOSIS — M791 Myalgia, unspecified site: Secondary | ICD-10-CM | POA: Diagnosis not present

## 2021-04-20 DIAGNOSIS — G43111 Migraine with aura, intractable, with status migrainosus: Secondary | ICD-10-CM | POA: Diagnosis not present

## 2021-04-20 DIAGNOSIS — G518 Other disorders of facial nerve: Secondary | ICD-10-CM | POA: Diagnosis not present

## 2021-04-20 DIAGNOSIS — M542 Cervicalgia: Secondary | ICD-10-CM | POA: Diagnosis not present

## 2021-04-20 DIAGNOSIS — G43109 Migraine with aura, not intractable, without status migrainosus: Secondary | ICD-10-CM | POA: Diagnosis not present

## 2021-05-19 DIAGNOSIS — G43111 Migraine with aura, intractable, with status migrainosus: Secondary | ICD-10-CM | POA: Diagnosis not present

## 2021-05-19 DIAGNOSIS — G43109 Migraine with aura, not intractable, without status migrainosus: Secondary | ICD-10-CM | POA: Diagnosis not present

## 2021-05-19 DIAGNOSIS — M791 Myalgia, unspecified site: Secondary | ICD-10-CM | POA: Diagnosis not present

## 2021-05-19 DIAGNOSIS — G518 Other disorders of facial nerve: Secondary | ICD-10-CM | POA: Diagnosis not present

## 2021-05-19 DIAGNOSIS — M542 Cervicalgia: Secondary | ICD-10-CM | POA: Diagnosis not present

## 2021-05-19 DIAGNOSIS — G43719 Chronic migraine without aura, intractable, without status migrainosus: Secondary | ICD-10-CM | POA: Diagnosis not present

## 2021-06-22 DIAGNOSIS — M542 Cervicalgia: Secondary | ICD-10-CM | POA: Diagnosis not present

## 2021-06-22 DIAGNOSIS — G43109 Migraine with aura, not intractable, without status migrainosus: Secondary | ICD-10-CM | POA: Diagnosis not present

## 2021-06-22 DIAGNOSIS — G43719 Chronic migraine without aura, intractable, without status migrainosus: Secondary | ICD-10-CM | POA: Diagnosis not present

## 2021-06-22 DIAGNOSIS — M791 Myalgia, unspecified site: Secondary | ICD-10-CM | POA: Diagnosis not present

## 2021-06-22 DIAGNOSIS — G518 Other disorders of facial nerve: Secondary | ICD-10-CM | POA: Diagnosis not present

## 2021-06-22 DIAGNOSIS — G43111 Migraine with aura, intractable, with status migrainosus: Secondary | ICD-10-CM | POA: Diagnosis not present

## 2021-07-27 DIAGNOSIS — G43719 Chronic migraine without aura, intractable, without status migrainosus: Secondary | ICD-10-CM | POA: Diagnosis not present

## 2021-07-27 DIAGNOSIS — M542 Cervicalgia: Secondary | ICD-10-CM | POA: Diagnosis not present

## 2021-07-27 DIAGNOSIS — G43109 Migraine with aura, not intractable, without status migrainosus: Secondary | ICD-10-CM | POA: Diagnosis not present

## 2021-07-27 DIAGNOSIS — G43111 Migraine with aura, intractable, with status migrainosus: Secondary | ICD-10-CM | POA: Diagnosis not present

## 2021-07-27 DIAGNOSIS — M791 Myalgia, unspecified site: Secondary | ICD-10-CM | POA: Diagnosis not present

## 2021-07-27 DIAGNOSIS — G518 Other disorders of facial nerve: Secondary | ICD-10-CM | POA: Diagnosis not present

## 2021-08-25 DIAGNOSIS — M542 Cervicalgia: Secondary | ICD-10-CM | POA: Diagnosis not present

## 2021-08-25 DIAGNOSIS — M791 Myalgia, unspecified site: Secondary | ICD-10-CM | POA: Diagnosis not present

## 2021-08-25 DIAGNOSIS — G518 Other disorders of facial nerve: Secondary | ICD-10-CM | POA: Diagnosis not present

## 2021-08-25 DIAGNOSIS — G43719 Chronic migraine without aura, intractable, without status migrainosus: Secondary | ICD-10-CM | POA: Diagnosis not present

## 2021-08-25 DIAGNOSIS — G43111 Migraine with aura, intractable, with status migrainosus: Secondary | ICD-10-CM | POA: Diagnosis not present

## 2021-08-25 DIAGNOSIS — G43109 Migraine with aura, not intractable, without status migrainosus: Secondary | ICD-10-CM | POA: Diagnosis not present

## 2021-09-16 DIAGNOSIS — S0181XA Laceration without foreign body of other part of head, initial encounter: Secondary | ICD-10-CM | POA: Diagnosis not present

## 2021-09-21 DIAGNOSIS — M791 Myalgia, unspecified site: Secondary | ICD-10-CM | POA: Diagnosis not present

## 2021-09-21 DIAGNOSIS — G43109 Migraine with aura, not intractable, without status migrainosus: Secondary | ICD-10-CM | POA: Diagnosis not present

## 2021-09-21 DIAGNOSIS — G43719 Chronic migraine without aura, intractable, without status migrainosus: Secondary | ICD-10-CM | POA: Diagnosis not present

## 2021-09-21 DIAGNOSIS — M542 Cervicalgia: Secondary | ICD-10-CM | POA: Diagnosis not present

## 2021-09-21 DIAGNOSIS — G43111 Migraine with aura, intractable, with status migrainosus: Secondary | ICD-10-CM | POA: Diagnosis not present

## 2021-09-21 DIAGNOSIS — G518 Other disorders of facial nerve: Secondary | ICD-10-CM | POA: Diagnosis not present

## 2021-10-26 IMAGING — MR MR MRA HEAD W/O CM
1 series · 22 of 48 positions shown · non-contrast
Comparison: CT head 01/22/2020.  MRI head 11/05/2013

CLINICAL DATA: Migraine headaches.  Generalized weakness.

EXAM:
MRI HEAD WITHOUT CONTRAST
MRA HEAD WITHOUT CONTRAST
TECHNIQUE: Multiplanar, multi-echo pulse sequences of the brain and surrounding
structures were acquired without intravenous contrast. Angiographic
images of the Circle of Willis were acquired using MRA technique
without intravenous contrast.

[Series 3: tof_3d_multi-slab · axial · 0.7mm · 0.35mm/px · z∈[-85,+42]mm · 22 of 191 slices shown]
[im 1/191]
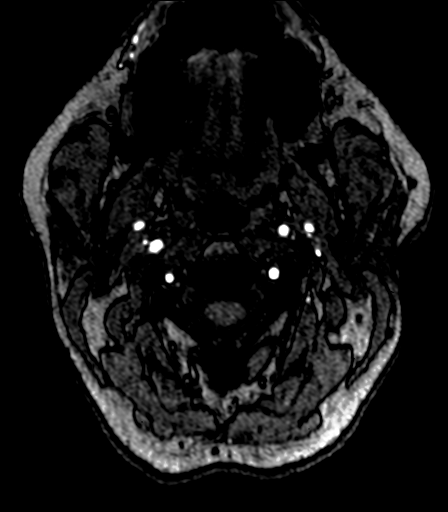
[im 5/191]
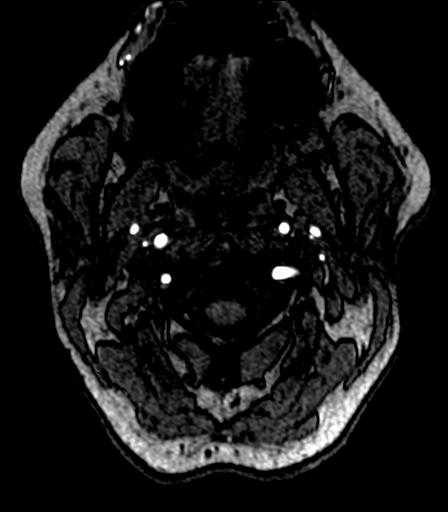
[im 9/191]
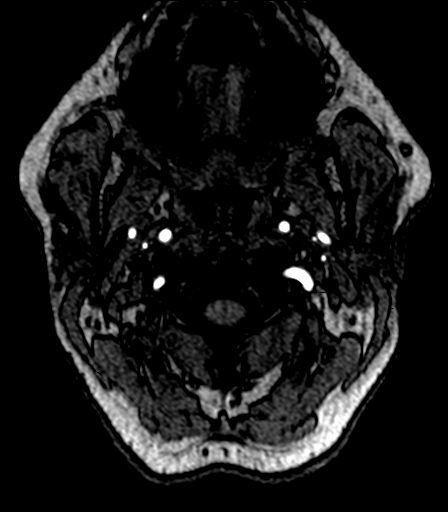
[im 13/191]
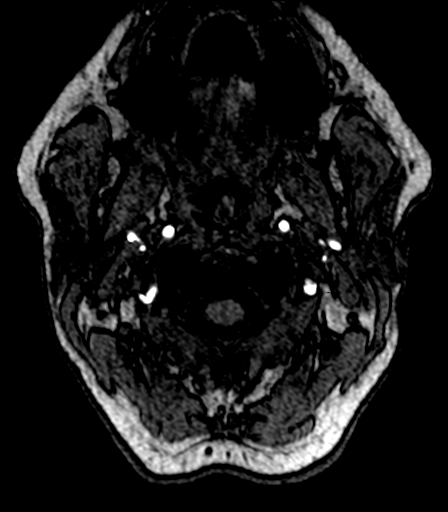
[im 17/191]
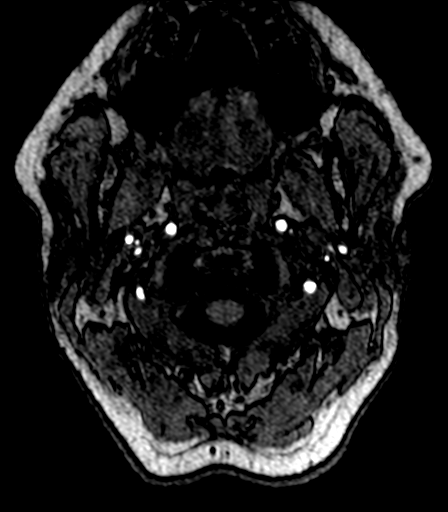
[im 21/191]
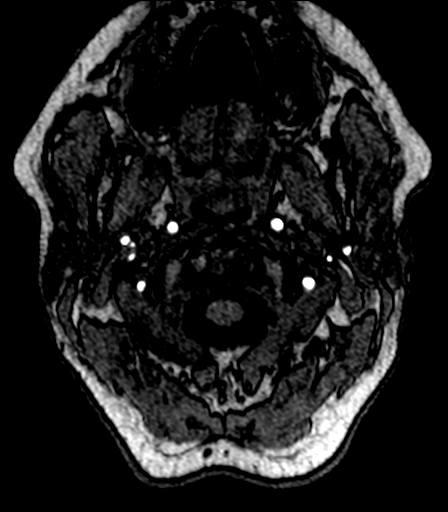
[im 25/191]
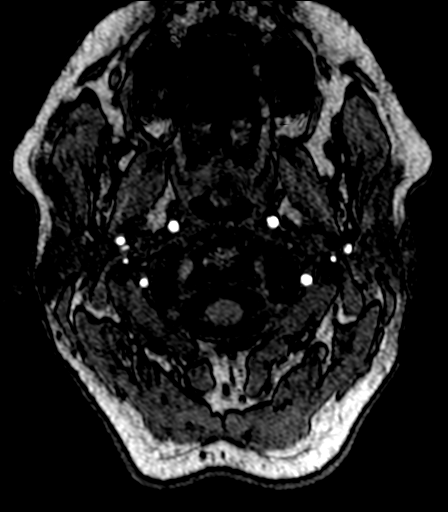
[im 29/191]
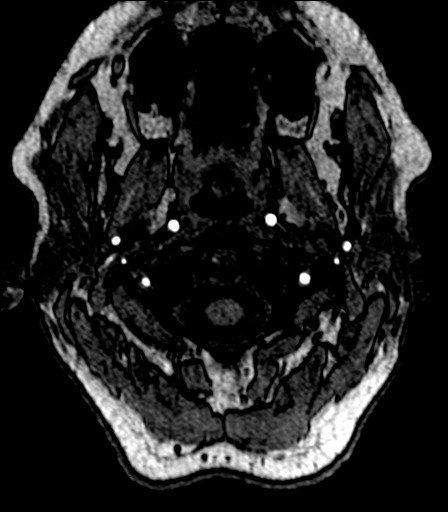
[im 33/191]
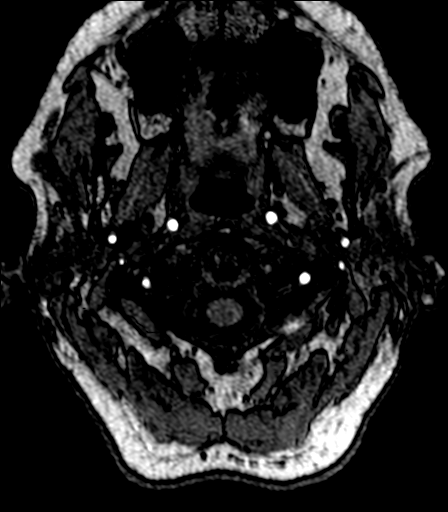
[im 37/191]
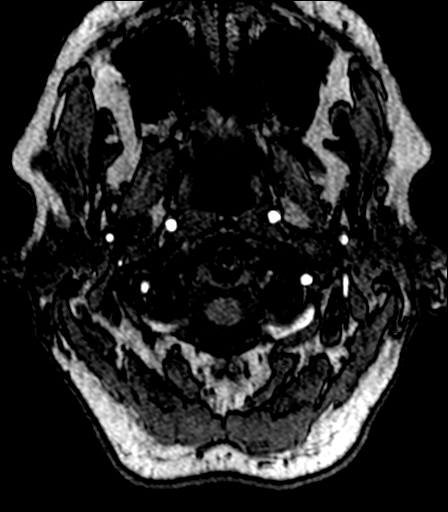
[im 41/191]
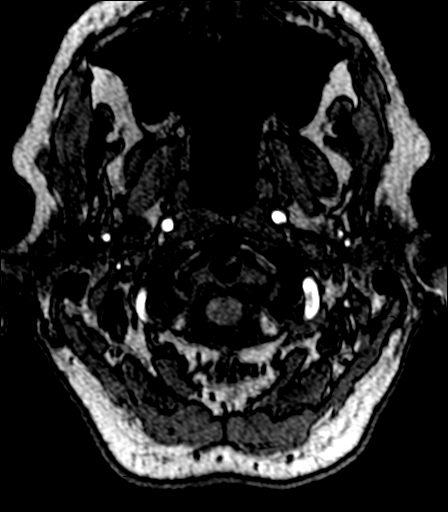
[im 45/191]
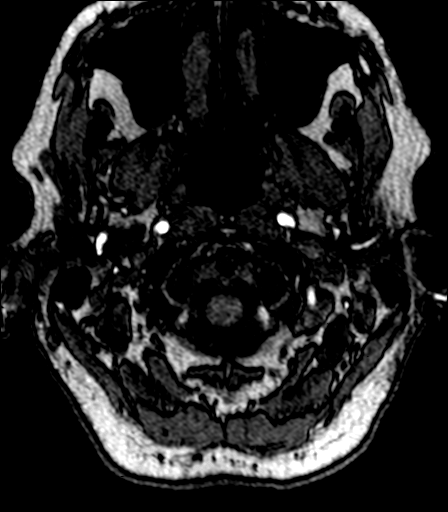
[im 49/191]
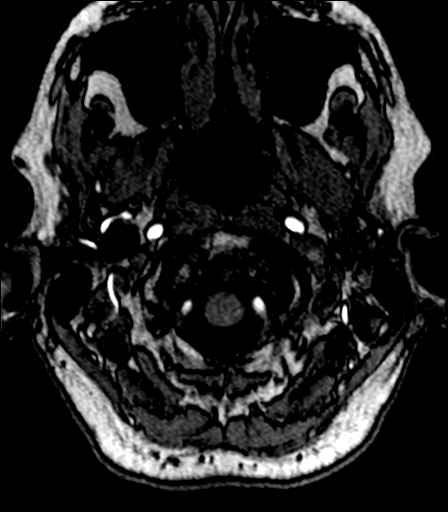
[im 53/191]
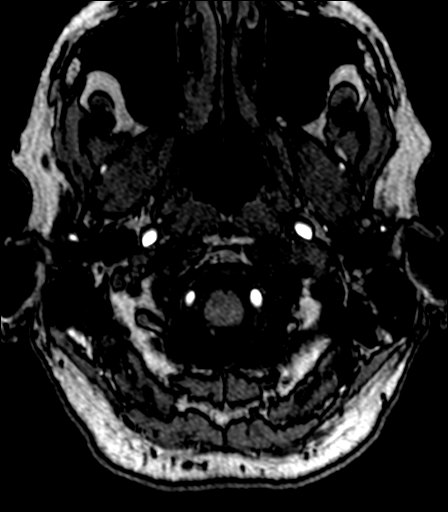
[im 61/191]
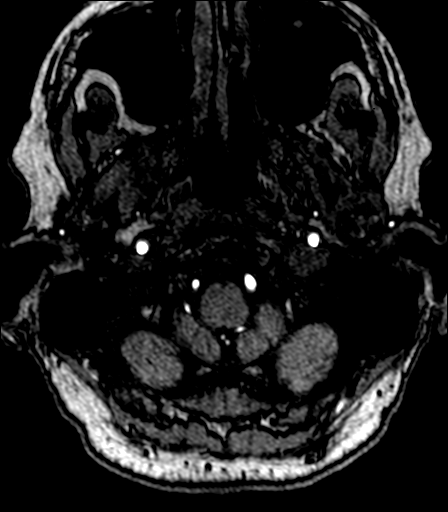
[im 85/191]
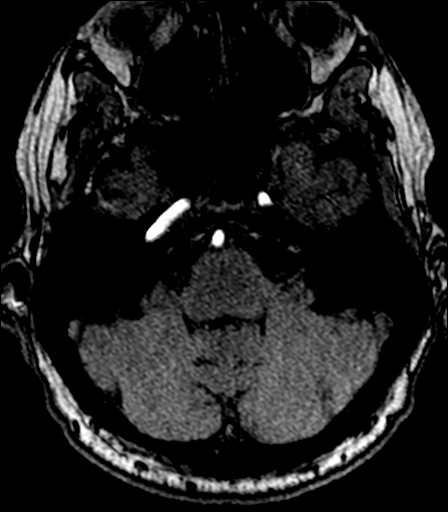
[im 98/191]
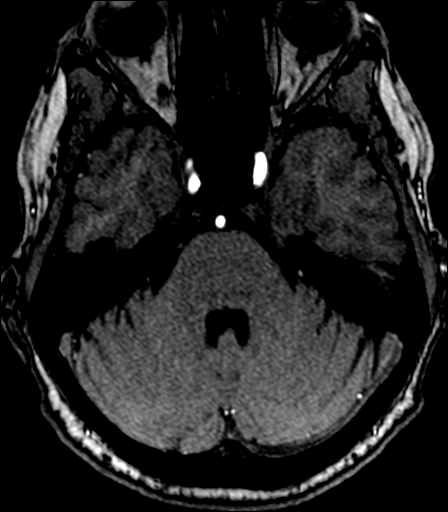
[im 110/191]
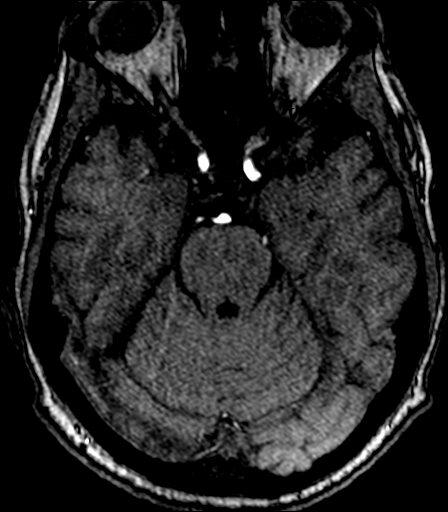
[im 134/191]
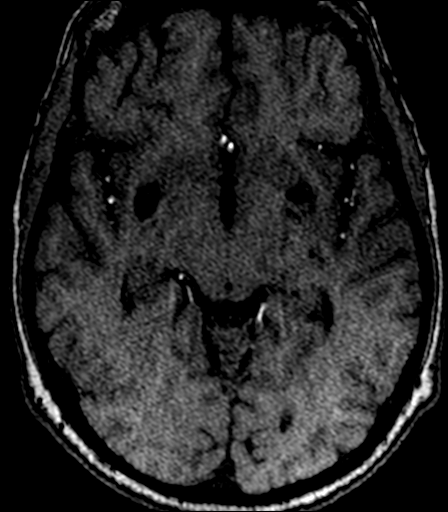
[im 158/191]
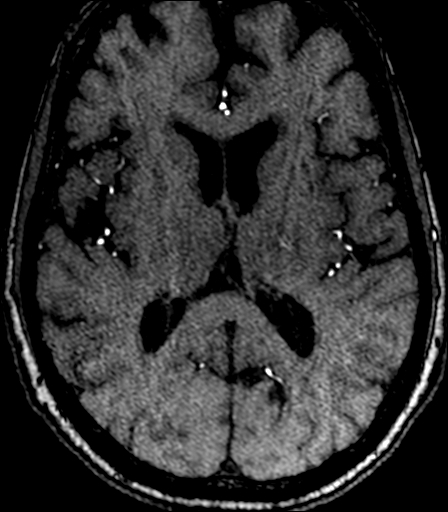
[im 162/191]
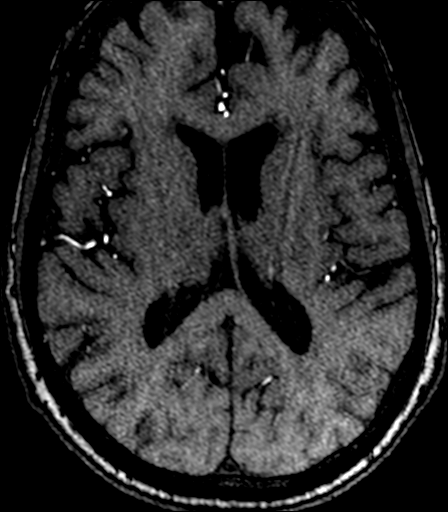
[im 182/191]
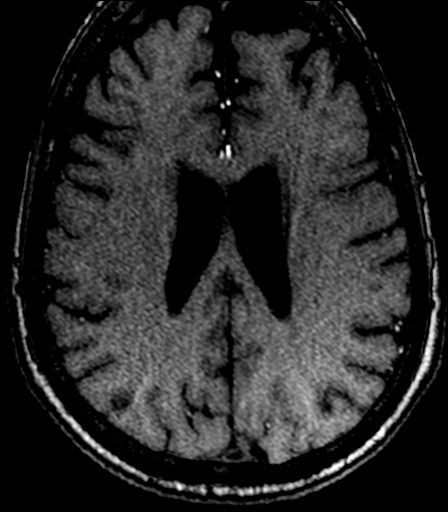

[22 of 48 positions shown; findings below may reference images not displayed]

FINDINGS: MRI HEAD FINDINGS

Brain: Negative for acute infarct. Progressive white matter changes
since 0514. T2 hyperintensities in the corpus callosum
periventricular white matter bilaterally have developed. Scattered
small deep white matter hyperintensities also have progressed. Mild
hyperintensity in the pons has progressed.

Ventricle size normal. Negative for hemorrhage or mass. Multiple
cysts in the basal ganglia bilaterally were present previously.
Interval development of small chronic lacunar infarctions in the
basal ganglia bilaterally

Vascular: Normal arterial flow voids.

Skull and upper cervical spine: Negative

Sinuses/Orbits: Negative

Other: None

MRA HEAD FINDINGS

Anterior circulation: Internal carotid artery widely patent
bilaterally. Anterior and middle cerebral arteries widely patent.
Negative for stenosis or large vessel occlusion.

Posterior circulation: Both vertebral arteries patent to the
basilar. PICA patent bilaterally. Basilar widely patent. Superior
cerebellar and posterior cerebral arteries patent bilaterally. Mild
stenosis in the posterior cerebral artery bilaterally.

Anatomic variants: Negative
IMPRESSION: 1. No acute intracranial abnormality. Progressive chronic
microvascular ischemic changes since 0514 MRI.
2. MRA head negative for intracranial occlusion. Mild
atherosclerotic irregularity and stenosis in the posterior cerebral
artery bilaterally.

## 2021-10-27 DIAGNOSIS — M542 Cervicalgia: Secondary | ICD-10-CM | POA: Diagnosis not present

## 2021-10-27 DIAGNOSIS — M791 Myalgia, unspecified site: Secondary | ICD-10-CM | POA: Diagnosis not present

## 2021-10-27 DIAGNOSIS — G43109 Migraine with aura, not intractable, without status migrainosus: Secondary | ICD-10-CM | POA: Diagnosis not present

## 2021-10-27 DIAGNOSIS — G43719 Chronic migraine without aura, intractable, without status migrainosus: Secondary | ICD-10-CM | POA: Diagnosis not present

## 2021-10-27 DIAGNOSIS — G518 Other disorders of facial nerve: Secondary | ICD-10-CM | POA: Diagnosis not present

## 2021-10-27 DIAGNOSIS — G43111 Migraine with aura, intractable, with status migrainosus: Secondary | ICD-10-CM | POA: Diagnosis not present

## 2021-11-25 DIAGNOSIS — M542 Cervicalgia: Secondary | ICD-10-CM | POA: Diagnosis not present

## 2021-11-25 DIAGNOSIS — G43111 Migraine with aura, intractable, with status migrainosus: Secondary | ICD-10-CM | POA: Diagnosis not present

## 2021-11-25 DIAGNOSIS — G43719 Chronic migraine without aura, intractable, without status migrainosus: Secondary | ICD-10-CM | POA: Diagnosis not present

## 2021-11-25 DIAGNOSIS — G43109 Migraine with aura, not intractable, without status migrainosus: Secondary | ICD-10-CM | POA: Diagnosis not present

## 2021-11-25 DIAGNOSIS — G518 Other disorders of facial nerve: Secondary | ICD-10-CM | POA: Diagnosis not present

## 2021-11-25 DIAGNOSIS — M791 Myalgia, unspecified site: Secondary | ICD-10-CM | POA: Diagnosis not present

## 2021-12-03 ENCOUNTER — Encounter: Payer: Self-pay | Admitting: Neurology

## 2021-12-17 NOTE — Progress Notes (Signed)
? ? ?Assessment/Plan:  ? ? ? Falls and gait instability ? -I think that this is multifactorial, but think that medications could play a fairly significant role.  Patient is on 2 mg of clonazepam, getting 105 pills per 30-day.  Per PDMP, patient picks these up anywhere from every month to every month and a half.  This equates to about 2-1/2 to 3-1/2 pills/day.  This is a significant amount of clonazepam, particularly in this age group.  Told patient that I would not change this, but he can talk to his prescribing physician (Dr. Toy Care) about this. ? -Most importantly, I reassured the patient that I saw no evidence of Parkinson's disease or any parkinsonian syndrome. ? -Patient does have long history of essential tremor, which is a known diagnosis for him.  This is really very mild.  This does not require additional medication. ? ?Subjective:  ? ?Carlos Mccall was seen today in the movement disorders clinic for neurologic consultation at the request of Orie Rout, MD.  The consultation is for the evaluation of gait instability and frequent falls and to rule out presence of Parkinson's disease.  Medical records made available to me are reviewed.  Patient currently under the care of of the headache clinic.  Patient previously under the care of [facility], Dr. Leta Baptist and Dr. Erling Cruz.  Pt with wife who supplements the history.   ? ? ?Specific Symptoms:  ?Tremor: Yes.  , states that he does not shake in the hands but does shake in the shoulders.  States that he was dx with ET by Dr. Erling Cruz.  States that he shakes when he yawns or holds glass.  He is R hand dominant.  No rest tremor.  It is activation tremor.  ?Family hx of similar:  no known fam hx - pt is adopted.   ?Voice: wife states that maybe a little less clear but pt states that "we yell a lot." ?Sleep:  ? Vivid Dreams:  No. ? Acting out dreams:  some acting out; did sleep walk as kid but no longer ?Wet Pillows: No. ?Postural symptoms:  Yes.  , pt has  been on Klonopin for years.  They report he has been on it since 1997 but report that this is lower than it was in the past.   ?Bradykinesia symptoms: slow movements; trouble walking in tandem ?Loss of smell:  No. - has sensitive smell ?Loss of taste:  No. ?Difficulty Swallowing:  No. ?Handwriting, micrographia: Yes.   ?Trouble with ADL's:  No. ? Trouble buttoning clothing: No. ?N/V:  Yes.  , with a migraine ?Prior exposure to reglan/antipsychotics: Yes.   On Reglan 10 mg tid (reports been off of it for a few months) - was on it for "years" but only when he had migraine but "it was making me nauseated." ? 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 ? ?Neuroimaging of the brain has previously been performed.  It is available for my review today. MRI brain done in 04/2021.  There was mild small vessel disease, including in the pons.   ? ? ?ALLERGIES:   ?Allergies  ?Allergen Reactions  ? Antihistamines, Chlorpheniramine-Type   ?  Low tolerance, reports that it gives him migraines   ? Quinine Derivatives Other (See Comments)  ?  migraine  ? Shellfish Allergy Other (See Comments)  ?  migraine  ? Tetracyclines & Related Nausea And Vomiting and Other (See Comments)  ?  headache  ? Thimerosal Other (See Comments)  ?  Unknown reaction  ? Codeine Rash and Other (See Comments)  ?  Jitters, hallucinations  ? Latex Rash and Other (See Comments)  ?  Only if patient wears it himself, causes rash  ? ? ?CURRENT MEDICATIONS:  ?Current Meds  ?Medication Sig  ? AIMOVIG 140 MG/ML SOAJ   ? baclofen (LIORESAL) 10 MG tablet Take 10 mg by mouth See admin instructions. Take 1 tablet (10 mg) by mouth at onset of migraine, up to two tablets daily and no more than two days in a week  ? clonazePAM (KLONOPIN) 2 MG tablet Take 1-5 mg by mouth See admin instructions. Take 1 tablet (2 mg) by mouth every morning, take 1/2 tablet (1 mg) late afternoon and 2 1/2 tablet (5 mg) at bedtime  ? EPINEPHrine 0.3 mg/0.3 mL IJ SOAJ injection Inject 0.3 mg into the  muscle once as needed (severe allergic reaction).   ? hydrOXYzine (ATARAX/VISTARIL) 25 MG tablet Take 25 mg by mouth at bedtime.   ? sertraline (ZOLOFT) 25 MG tablet Take 50 mg by mouth at bedtime.   ? venlafaxine (EFFEXOR-XR) 150 MG 24 hr capsule Take 150 mg by mouth daily.    ? zonisamide (ZONEGRAN) 25 MG capsule Take 25-50 mg by mouth See admin instructions. Take 1 capsule (25 mg) by mouth every morning and 2 capsules (50 mg) at night - for migraine prevention  ?  ? ?Objective:  ? ?VITALS:   ?Vitals:  ? 12/21/21 1330  ?Weight: 163 lb 6.4 oz (74.1 kg)  ?Height: '5\' 7"'$  (1.702 m)  ? ? ?GEN:  The patient appears stated age and is in NAD.  Pt has difficulty staying on topic and is a bit manic appearing (very verbose, difficulty with focus/concentration) ?HEENT:  Normocephalic, atraumatic.  The mucous membranes are moist. The superficial temporal arteries are without ropiness or tenderness. ?CV:  RRR ?Lungs:  CTAB ?Neck/HEME:  There are no carotid bruits bilaterally. ? ?Neurological examination: ? ?Orientation: The patient is alert and oriented x3.  ?Cranial nerves: There is good facial symmetry. Extraocular muscles are intact. The visual fields are full to confrontational testing. The speech is fluent and clear but a bit pressured. Soft palate rises symmetrically and there is no tongue deviation. Hearing is intact to conversational tone. ?Sensation: Sensation is intact to light and pinprick throughout (facial, trunk, extremities). Vibration is intact at the bilateral big toe. There is no extinction with double simultaneous stimulation. There is no sensory dermatomal level identified. ?Motor: Strength is 5/5 in the bilateral upper and lower extremities.   Shoulder shrug is equal and symmetric.  There is no pronator drift. ?Deep tendon reflexes: Deep tendon reflexes are 2-2+/4 at the bilateral biceps, triceps, brachioradialis, patella and achilles. Plantar responses are downgoing bilaterally. ? ?Movement  examination: ?Tone: There is nl tone in the bilateral upper extremities.  The tone in the lower extremities is n;.  ?Abnormal movements: there is mild postural and intention tremor bilaterally ?Coordination:  There is no decremation with RAM's, with any form of RAMS, including alternating supination and pronation of the forearm, hand opening and closing, finger taps, heel taps and toe taps. ?Gait and Station: The patient has no difficulty arising out of a deep-seated chair without the use of the hands. The patient's stride length is good.  He actually turns on one leg to show off that he can do that.      ? ? ? ? ?Total time spent on today's visit was 47 minutes, including both face-to-face time and  nonface-to-face time.  Time included that spent on review of records (prior notes available to me/labs/imaging if pertinent), discussing treatment and goals, answering patient's questions and coordinating care. ? ?Cc:  Pcp, No ? ?

## 2021-12-21 ENCOUNTER — Other Ambulatory Visit: Payer: Self-pay

## 2021-12-21 ENCOUNTER — Encounter: Payer: Self-pay | Admitting: Neurology

## 2021-12-21 ENCOUNTER — Ambulatory Visit: Payer: Medicare HMO | Admitting: Neurology

## 2021-12-21 VITALS — BP 142/80 | Ht 67.0 in | Wt 163.4 lb

## 2021-12-21 DIAGNOSIS — G250 Essential tremor: Secondary | ICD-10-CM | POA: Diagnosis not present

## 2021-12-21 DIAGNOSIS — T887XXA Unspecified adverse effect of drug or medicament, initial encounter: Secondary | ICD-10-CM

## 2021-12-21 NOTE — Patient Instructions (Addendum)
You do not have Parkinsons disease.  You have mild essential tremor.  I think that the klonopin dosage is the biggest issue here.   You will need to talk to your prescribing physicians about this.  You should not change any of your medications without talking to your physician. ? ?The physicians and staff at [facility] are committed to providing excellent care. You may receive a survey requesting feedback about your experience at our office. We strive to receive "very good" responses to the survey questions. If you feel that your experience would prevent you from giving the office a "very good " response, please contact our office to try to remedy the situation. We may be reached at [phone]. Thank you for taking the time out of your busy day to complete the survey. ? ? ? ?

## 2021-12-22 ENCOUNTER — Telehealth: Payer: Self-pay | Admitting: Neurology

## 2021-12-22 DIAGNOSIS — G43719 Chronic migraine without aura, intractable, without status migrainosus: Secondary | ICD-10-CM | POA: Diagnosis not present

## 2021-12-22 DIAGNOSIS — M791 Myalgia, unspecified site: Secondary | ICD-10-CM | POA: Diagnosis not present

## 2021-12-22 DIAGNOSIS — G518 Other disorders of facial nerve: Secondary | ICD-10-CM | POA: Diagnosis not present

## 2021-12-22 DIAGNOSIS — M542 Cervicalgia: Secondary | ICD-10-CM | POA: Diagnosis not present

## 2021-12-22 NOTE — Telephone Encounter (Signed)
Pt came in yesterday and brought old records in. He meant to bring those home with him. She would like for [place] to mail those papers back to them at the address in the account. ?

## 2021-12-22 NOTE — Telephone Encounter (Signed)
I called patients wife to let her know I dropped them in the mail yesterday for them ?

## 2021-12-23 ENCOUNTER — Telehealth: Payer: Self-pay | Admitting: Neurology

## 2021-12-23 NOTE — Telephone Encounter (Signed)
Patients wife called, she wants Tat to know robbi is only taking 2 tablets a day. She said the sheet said a wrong dosage. ?

## 2021-12-23 NOTE — Telephone Encounter (Signed)
Updated in medication list as a note in the prescription  ?

## 2021-12-23 NOTE — Telephone Encounter (Signed)
I am sorry. Clonazepam. She said they spoke with Tat regarding this ?

## 2022-01-19 DIAGNOSIS — G43719 Chronic migraine without aura, intractable, without status migrainosus: Secondary | ICD-10-CM | POA: Diagnosis not present

## 2022-01-19 DIAGNOSIS — M542 Cervicalgia: Secondary | ICD-10-CM | POA: Diagnosis not present

## 2022-01-19 DIAGNOSIS — G43109 Migraine with aura, not intractable, without status migrainosus: Secondary | ICD-10-CM | POA: Diagnosis not present

## 2022-01-19 DIAGNOSIS — M791 Myalgia, unspecified site: Secondary | ICD-10-CM | POA: Diagnosis not present

## 2022-01-19 DIAGNOSIS — G518 Other disorders of facial nerve: Secondary | ICD-10-CM | POA: Diagnosis not present

## 2022-02-16 DIAGNOSIS — M791 Myalgia, unspecified site: Secondary | ICD-10-CM | POA: Diagnosis not present

## 2022-02-16 DIAGNOSIS — G43719 Chronic migraine without aura, intractable, without status migrainosus: Secondary | ICD-10-CM | POA: Diagnosis not present

## 2022-02-16 DIAGNOSIS — M542 Cervicalgia: Secondary | ICD-10-CM | POA: Diagnosis not present

## 2022-02-16 DIAGNOSIS — G43109 Migraine with aura, not intractable, without status migrainosus: Secondary | ICD-10-CM | POA: Diagnosis not present

## 2022-02-16 DIAGNOSIS — G518 Other disorders of facial nerve: Secondary | ICD-10-CM | POA: Diagnosis not present

## 2022-03-09 DIAGNOSIS — M5126 Other intervertebral disc displacement, lumbar region: Secondary | ICD-10-CM | POA: Diagnosis not present

## 2022-03-21 DIAGNOSIS — M542 Cervicalgia: Secondary | ICD-10-CM | POA: Diagnosis not present

## 2022-03-21 DIAGNOSIS — M791 Myalgia, unspecified site: Secondary | ICD-10-CM | POA: Diagnosis not present

## 2022-03-21 DIAGNOSIS — G518 Other disorders of facial nerve: Secondary | ICD-10-CM | POA: Diagnosis not present

## 2022-03-21 DIAGNOSIS — G43719 Chronic migraine without aura, intractable, without status migrainosus: Secondary | ICD-10-CM | POA: Diagnosis not present

## 2022-04-18 DIAGNOSIS — G43719 Chronic migraine without aura, intractable, without status migrainosus: Secondary | ICD-10-CM | POA: Diagnosis not present

## 2022-04-18 DIAGNOSIS — G518 Other disorders of facial nerve: Secondary | ICD-10-CM | POA: Diagnosis not present

## 2022-04-18 DIAGNOSIS — M542 Cervicalgia: Secondary | ICD-10-CM | POA: Diagnosis not present

## 2022-04-18 DIAGNOSIS — M791 Myalgia, unspecified site: Secondary | ICD-10-CM | POA: Diagnosis not present

## 2022-05-10 DIAGNOSIS — M4316 Spondylolisthesis, lumbar region: Secondary | ICD-10-CM | POA: Diagnosis not present

## 2022-05-10 DIAGNOSIS — M48061 Spinal stenosis, lumbar region without neurogenic claudication: Secondary | ICD-10-CM | POA: Diagnosis not present

## 2022-05-10 DIAGNOSIS — M545 Low back pain, unspecified: Secondary | ICD-10-CM | POA: Diagnosis not present

## 2022-05-19 DIAGNOSIS — M791 Myalgia, unspecified site: Secondary | ICD-10-CM | POA: Diagnosis not present

## 2022-05-19 DIAGNOSIS — M542 Cervicalgia: Secondary | ICD-10-CM | POA: Diagnosis not present

## 2022-05-19 DIAGNOSIS — G518 Other disorders of facial nerve: Secondary | ICD-10-CM | POA: Diagnosis not present

## 2022-05-19 DIAGNOSIS — G43109 Migraine with aura, not intractable, without status migrainosus: Secondary | ICD-10-CM | POA: Diagnosis not present

## 2022-05-23 DIAGNOSIS — M48062 Spinal stenosis, lumbar region with neurogenic claudication: Secondary | ICD-10-CM | POA: Diagnosis not present

## 2022-05-23 DIAGNOSIS — M4316 Spondylolisthesis, lumbar region: Secondary | ICD-10-CM | POA: Diagnosis not present

## 2022-05-26 DIAGNOSIS — E039 Hypothyroidism, unspecified: Secondary | ICD-10-CM | POA: Diagnosis not present

## 2022-05-26 DIAGNOSIS — I10 Essential (primary) hypertension: Secondary | ICD-10-CM | POA: Diagnosis not present

## 2022-05-26 DIAGNOSIS — Z125 Encounter for screening for malignant neoplasm of prostate: Secondary | ICD-10-CM | POA: Diagnosis not present

## 2022-05-26 DIAGNOSIS — E785 Hyperlipidemia, unspecified: Secondary | ICD-10-CM | POA: Diagnosis not present

## 2022-05-26 DIAGNOSIS — R69 Illness, unspecified: Secondary | ICD-10-CM | POA: Diagnosis not present

## 2022-05-26 DIAGNOSIS — G43009 Migraine without aura, not intractable, without status migrainosus: Secondary | ICD-10-CM | POA: Diagnosis not present

## 2022-05-26 DIAGNOSIS — E559 Vitamin D deficiency, unspecified: Secondary | ICD-10-CM | POA: Diagnosis not present

## 2022-06-21 DIAGNOSIS — G518 Other disorders of facial nerve: Secondary | ICD-10-CM | POA: Diagnosis not present

## 2022-06-21 DIAGNOSIS — M791 Myalgia, unspecified site: Secondary | ICD-10-CM | POA: Diagnosis not present

## 2022-06-21 DIAGNOSIS — M542 Cervicalgia: Secondary | ICD-10-CM | POA: Diagnosis not present

## 2022-06-21 DIAGNOSIS — G43109 Migraine with aura, not intractable, without status migrainosus: Secondary | ICD-10-CM | POA: Diagnosis not present

## 2022-06-22 ENCOUNTER — Other Ambulatory Visit: Payer: Self-pay | Admitting: Registered Nurse

## 2022-06-22 ENCOUNTER — Other Ambulatory Visit (HOSPITAL_BASED_OUTPATIENT_CLINIC_OR_DEPARTMENT_OTHER): Payer: Self-pay | Admitting: Registered Nurse

## 2022-06-22 DIAGNOSIS — R69 Illness, unspecified: Secondary | ICD-10-CM | POA: Diagnosis not present

## 2022-06-22 DIAGNOSIS — E7800 Pure hypercholesterolemia, unspecified: Secondary | ICD-10-CM | POA: Diagnosis not present

## 2022-06-22 DIAGNOSIS — Z0000 Encounter for general adult medical examination without abnormal findings: Secondary | ICD-10-CM | POA: Diagnosis not present

## 2022-06-22 DIAGNOSIS — Z01818 Encounter for other preprocedural examination: Secondary | ICD-10-CM | POA: Diagnosis not present

## 2022-06-22 DIAGNOSIS — R7989 Other specified abnormal findings of blood chemistry: Secondary | ICD-10-CM | POA: Diagnosis not present

## 2022-06-22 DIAGNOSIS — I10 Essential (primary) hypertension: Secondary | ICD-10-CM | POA: Diagnosis not present

## 2022-06-27 ENCOUNTER — Other Ambulatory Visit: Payer: Self-pay | Admitting: Neurosurgery

## 2022-06-27 ENCOUNTER — Other Ambulatory Visit: Payer: Self-pay | Admitting: Registered Nurse

## 2022-06-27 DIAGNOSIS — E7800 Pure hypercholesterolemia, unspecified: Secondary | ICD-10-CM

## 2022-07-05 DIAGNOSIS — M4316 Spondylolisthesis, lumbar region: Secondary | ICD-10-CM | POA: Diagnosis not present

## 2022-07-11 DIAGNOSIS — H43812 Vitreous degeneration, left eye: Secondary | ICD-10-CM | POA: Diagnosis not present

## 2022-07-11 DIAGNOSIS — G43809 Other migraine, not intractable, without status migrainosus: Secondary | ICD-10-CM | POA: Diagnosis not present

## 2022-07-11 DIAGNOSIS — H5319 Other subjective visual disturbances: Secondary | ICD-10-CM | POA: Diagnosis not present

## 2022-07-13 ENCOUNTER — Telehealth: Payer: Self-pay

## 2022-07-13 NOTE — Patient Outreach (Signed)
  Care Coordination   07/13/2022 Name: Carlos Mccall MRN: [id number] DOB: Jun 23, 1956   Care Coordination Outreach Attempts:  An unsuccessful telephone outreach was attempted today to offer the patient information about available care coordination services as a benefit of their health plan.   Follow Up Plan:  No further outreach attempts will be made at this time. We have been unable to contact the patient to offer or enroll patient in care coordination services  Encounter Outcome:  Pt. Refused  Care Coordination Interventions Activated:  No   Care Coordination Interventions:  No, not indicated    Jone Baseman, RN, MSN [facility] Care Management Care Management Coordinator Direct Line [phone]

## 2022-07-15 ENCOUNTER — Other Ambulatory Visit (HOSPITAL_COMMUNITY): Payer: Medicare HMO

## 2022-07-20 DIAGNOSIS — M542 Cervicalgia: Secondary | ICD-10-CM | POA: Diagnosis not present

## 2022-07-20 DIAGNOSIS — G518 Other disorders of facial nerve: Secondary | ICD-10-CM | POA: Diagnosis not present

## 2022-07-20 DIAGNOSIS — G43719 Chronic migraine without aura, intractable, without status migrainosus: Secondary | ICD-10-CM | POA: Diagnosis not present

## 2022-07-20 DIAGNOSIS — M791 Myalgia, unspecified site: Secondary | ICD-10-CM | POA: Diagnosis not present

## 2022-07-21 DIAGNOSIS — M4316 Spondylolisthesis, lumbar region: Secondary | ICD-10-CM | POA: Diagnosis not present

## 2022-07-21 DIAGNOSIS — M5459 Other low back pain: Secondary | ICD-10-CM | POA: Diagnosis not present

## 2022-07-26 ENCOUNTER — Inpatient Hospital Stay: Admit: 2022-07-26 | Payer: Medicare HMO | Admitting: Neurosurgery

## 2022-07-26 DIAGNOSIS — G959 Disease of spinal cord, unspecified: Secondary | ICD-10-CM | POA: Diagnosis not present

## 2022-07-26 DIAGNOSIS — M4802 Spinal stenosis, cervical region: Secondary | ICD-10-CM | POA: Diagnosis not present

## 2022-07-26 SURGERY — POSTERIOR LUMBAR FUSION 2 LEVEL
Anesthesia: General

## 2022-08-04 DIAGNOSIS — M4316 Spondylolisthesis, lumbar region: Secondary | ICD-10-CM | POA: Diagnosis not present

## 2022-08-04 DIAGNOSIS — M5459 Other low back pain: Secondary | ICD-10-CM | POA: Diagnosis not present

## 2022-08-05 ENCOUNTER — Ambulatory Visit
Admission: RE | Admit: 2022-08-05 | Discharge: 2022-08-05 | Disposition: A | Discharge status: Home / Self Care | Payer: No Typology Code available for payment source | Source: Ambulatory Visit | Attending: Registered Nurse | Admitting: Registered Nurse

## 2022-08-05 DIAGNOSIS — E7800 Pure hypercholesterolemia, unspecified: Secondary | ICD-10-CM

## 2022-08-05 DIAGNOSIS — I2510 Atherosclerotic heart disease of native coronary artery without angina pectoris: Secondary | ICD-10-CM | POA: Diagnosis not present

## 2022-08-10 DIAGNOSIS — G959 Disease of spinal cord, unspecified: Secondary | ICD-10-CM | POA: Diagnosis not present

## 2022-08-23 DIAGNOSIS — G43109 Migraine with aura, not intractable, without status migrainosus: Secondary | ICD-10-CM | POA: Diagnosis not present

## 2022-08-23 DIAGNOSIS — M542 Cervicalgia: Secondary | ICD-10-CM | POA: Diagnosis not present

## 2022-08-23 DIAGNOSIS — G518 Other disorders of facial nerve: Secondary | ICD-10-CM | POA: Diagnosis not present

## 2022-08-23 DIAGNOSIS — M791 Myalgia, unspecified site: Secondary | ICD-10-CM | POA: Diagnosis not present

## 2022-08-24 DIAGNOSIS — H43812 Vitreous degeneration, left eye: Secondary | ICD-10-CM | POA: Diagnosis not present

## 2022-09-27 DIAGNOSIS — M542 Cervicalgia: Secondary | ICD-10-CM | POA: Diagnosis not present

## 2022-09-27 DIAGNOSIS — M791 Myalgia, unspecified site: Secondary | ICD-10-CM | POA: Diagnosis not present

## 2022-09-27 DIAGNOSIS — G43719 Chronic migraine without aura, intractable, without status migrainosus: Secondary | ICD-10-CM | POA: Diagnosis not present

## 2022-09-27 DIAGNOSIS — G518 Other disorders of facial nerve: Secondary | ICD-10-CM | POA: Diagnosis not present

## 2022-10-10 DIAGNOSIS — M4722 Other spondylosis with radiculopathy, cervical region: Secondary | ICD-10-CM | POA: Diagnosis not present

## 2022-10-10 DIAGNOSIS — M50022 Cervical disc disorder at C5-C6 level with myelopathy: Secondary | ICD-10-CM | POA: Diagnosis not present

## 2022-10-10 DIAGNOSIS — M50023 Cervical disc disorder at C6-C7 level with myelopathy: Secondary | ICD-10-CM | POA: Diagnosis not present

## 2022-10-21 DIAGNOSIS — G959 Disease of spinal cord, unspecified: Secondary | ICD-10-CM | POA: Diagnosis not present

## 2022-11-22 DIAGNOSIS — M542 Cervicalgia: Secondary | ICD-10-CM | POA: Diagnosis not present

## 2022-11-22 DIAGNOSIS — G518 Other disorders of facial nerve: Secondary | ICD-10-CM | POA: Diagnosis not present

## 2022-11-22 DIAGNOSIS — G43109 Migraine with aura, not intractable, without status migrainosus: Secondary | ICD-10-CM | POA: Diagnosis not present

## 2022-11-22 DIAGNOSIS — M791 Myalgia, unspecified site: Secondary | ICD-10-CM | POA: Diagnosis not present

## 2022-11-30 DIAGNOSIS — G959 Disease of spinal cord, unspecified: Secondary | ICD-10-CM | POA: Diagnosis not present

## 2022-12-28 DIAGNOSIS — M542 Cervicalgia: Secondary | ICD-10-CM | POA: Diagnosis not present

## 2022-12-28 DIAGNOSIS — G518 Other disorders of facial nerve: Secondary | ICD-10-CM | POA: Diagnosis not present

## 2022-12-28 DIAGNOSIS — G43719 Chronic migraine without aura, intractable, without status migrainosus: Secondary | ICD-10-CM | POA: Diagnosis not present

## 2022-12-28 DIAGNOSIS — M791 Myalgia, unspecified site: Secondary | ICD-10-CM | POA: Diagnosis not present

## 2023-01-25 DIAGNOSIS — G518 Other disorders of facial nerve: Secondary | ICD-10-CM | POA: Diagnosis not present

## 2023-01-25 DIAGNOSIS — M791 Myalgia, unspecified site: Secondary | ICD-10-CM | POA: Diagnosis not present

## 2023-01-25 DIAGNOSIS — M542 Cervicalgia: Secondary | ICD-10-CM | POA: Diagnosis not present

## 2023-01-25 DIAGNOSIS — G43719 Chronic migraine without aura, intractable, without status migrainosus: Secondary | ICD-10-CM | POA: Diagnosis not present

## 2023-02-23 DIAGNOSIS — G518 Other disorders of facial nerve: Secondary | ICD-10-CM | POA: Diagnosis not present

## 2023-02-23 DIAGNOSIS — G43719 Chronic migraine without aura, intractable, without status migrainosus: Secondary | ICD-10-CM | POA: Diagnosis not present

## 2023-02-23 DIAGNOSIS — M542 Cervicalgia: Secondary | ICD-10-CM | POA: Diagnosis not present

## 2023-02-23 DIAGNOSIS — G43109 Migraine with aura, not intractable, without status migrainosus: Secondary | ICD-10-CM | POA: Diagnosis not present

## 2023-02-23 DIAGNOSIS — M791 Myalgia, unspecified site: Secondary | ICD-10-CM | POA: Diagnosis not present

## 2023-03-01 DIAGNOSIS — M4714 Other spondylosis with myelopathy, thoracic region: Secondary | ICD-10-CM | POA: Diagnosis not present

## 2023-03-21 DIAGNOSIS — M47814 Spondylosis without myelopathy or radiculopathy, thoracic region: Secondary | ICD-10-CM | POA: Diagnosis not present

## 2023-03-21 DIAGNOSIS — M4714 Other spondylosis with myelopathy, thoracic region: Secondary | ICD-10-CM | POA: Diagnosis not present

## 2023-03-22 DIAGNOSIS — M542 Cervicalgia: Secondary | ICD-10-CM | POA: Diagnosis not present

## 2023-03-22 DIAGNOSIS — G43719 Chronic migraine without aura, intractable, without status migrainosus: Secondary | ICD-10-CM | POA: Diagnosis not present

## 2023-03-22 DIAGNOSIS — M791 Myalgia, unspecified site: Secondary | ICD-10-CM | POA: Diagnosis not present

## 2023-03-22 DIAGNOSIS — G43109 Migraine with aura, not intractable, without status migrainosus: Secondary | ICD-10-CM | POA: Diagnosis not present

## 2023-03-22 DIAGNOSIS — G518 Other disorders of facial nerve: Secondary | ICD-10-CM | POA: Diagnosis not present

## 2023-03-29 DIAGNOSIS — M4714 Other spondylosis with myelopathy, thoracic region: Secondary | ICD-10-CM | POA: Diagnosis not present

## 2023-04-17 DIAGNOSIS — L738 Other specified follicular disorders: Secondary | ICD-10-CM | POA: Diagnosis not present

## 2023-04-17 DIAGNOSIS — D1801 Hemangioma of skin and subcutaneous tissue: Secondary | ICD-10-CM | POA: Diagnosis not present

## 2023-04-17 DIAGNOSIS — L821 Other seborrheic keratosis: Secondary | ICD-10-CM | POA: Diagnosis not present

## 2023-04-17 DIAGNOSIS — L853 Xerosis cutis: Secondary | ICD-10-CM | POA: Diagnosis not present

## 2023-04-17 DIAGNOSIS — D225 Melanocytic nevi of trunk: Secondary | ICD-10-CM | POA: Diagnosis not present

## 2023-04-25 DIAGNOSIS — M542 Cervicalgia: Secondary | ICD-10-CM | POA: Diagnosis not present

## 2023-04-25 DIAGNOSIS — M791 Myalgia, unspecified site: Secondary | ICD-10-CM | POA: Diagnosis not present

## 2023-04-25 DIAGNOSIS — G518 Other disorders of facial nerve: Secondary | ICD-10-CM | POA: Diagnosis not present

## 2023-04-25 DIAGNOSIS — G43109 Migraine with aura, not intractable, without status migrainosus: Secondary | ICD-10-CM | POA: Diagnosis not present

## 2023-05-24 DIAGNOSIS — G43109 Migraine with aura, not intractable, without status migrainosus: Secondary | ICD-10-CM | POA: Diagnosis not present

## 2023-05-24 DIAGNOSIS — M542 Cervicalgia: Secondary | ICD-10-CM | POA: Diagnosis not present

## 2023-05-24 DIAGNOSIS — M791 Myalgia, unspecified site: Secondary | ICD-10-CM | POA: Diagnosis not present

## 2023-05-24 DIAGNOSIS — G518 Other disorders of facial nerve: Secondary | ICD-10-CM | POA: Diagnosis not present

## 2023-05-30 DIAGNOSIS — E039 Hypothyroidism, unspecified: Secondary | ICD-10-CM | POA: Diagnosis not present

## 2023-05-30 DIAGNOSIS — E7800 Pure hypercholesterolemia, unspecified: Secondary | ICD-10-CM | POA: Diagnosis not present

## 2023-05-30 DIAGNOSIS — Z0000 Encounter for general adult medical examination without abnormal findings: Secondary | ICD-10-CM | POA: Diagnosis not present

## 2023-05-30 DIAGNOSIS — K7689 Other specified diseases of liver: Secondary | ICD-10-CM | POA: Diagnosis not present

## 2023-06-21 DIAGNOSIS — E559 Vitamin D deficiency, unspecified: Secondary | ICD-10-CM | POA: Diagnosis not present

## 2023-06-21 DIAGNOSIS — E7800 Pure hypercholesterolemia, unspecified: Secondary | ICD-10-CM | POA: Diagnosis not present

## 2023-06-21 DIAGNOSIS — Z0000 Encounter for general adult medical examination without abnormal findings: Secondary | ICD-10-CM | POA: Diagnosis not present

## 2023-06-21 DIAGNOSIS — N1831 Chronic kidney disease, stage 3a: Secondary | ICD-10-CM | POA: Diagnosis not present

## 2023-06-21 DIAGNOSIS — I10 Essential (primary) hypertension: Secondary | ICD-10-CM | POA: Diagnosis not present

## 2023-11-06 DIAGNOSIS — G43109 Migraine with aura, not intractable, without status migrainosus: Secondary | ICD-10-CM | POA: Diagnosis not present

## 2023-11-06 DIAGNOSIS — M542 Cervicalgia: Secondary | ICD-10-CM | POA: Diagnosis not present

## 2023-11-06 DIAGNOSIS — M791 Myalgia, unspecified site: Secondary | ICD-10-CM | POA: Diagnosis not present

## 2023-11-06 DIAGNOSIS — G518 Other disorders of facial nerve: Secondary | ICD-10-CM | POA: Diagnosis not present

## 2023-12-05 DIAGNOSIS — M542 Cervicalgia: Secondary | ICD-10-CM | POA: Diagnosis not present

## 2023-12-05 DIAGNOSIS — G518 Other disorders of facial nerve: Secondary | ICD-10-CM | POA: Diagnosis not present

## 2023-12-05 DIAGNOSIS — G43109 Migraine with aura, not intractable, without status migrainosus: Secondary | ICD-10-CM | POA: Diagnosis not present

## 2023-12-05 DIAGNOSIS — M791 Myalgia, unspecified site: Secondary | ICD-10-CM | POA: Diagnosis not present

## 2024-01-03 DIAGNOSIS — M791 Myalgia, unspecified site: Secondary | ICD-10-CM | POA: Diagnosis not present

## 2024-01-03 DIAGNOSIS — M542 Cervicalgia: Secondary | ICD-10-CM | POA: Diagnosis not present

## 2024-01-03 DIAGNOSIS — G43719 Chronic migraine without aura, intractable, without status migrainosus: Secondary | ICD-10-CM | POA: Diagnosis not present

## 2024-01-03 DIAGNOSIS — G518 Other disorders of facial nerve: Secondary | ICD-10-CM | POA: Diagnosis not present

## 2024-01-29 DIAGNOSIS — K080 Exfoliation of teeth due to systemic causes: Secondary | ICD-10-CM | POA: Diagnosis not present

## 2024-02-07 DIAGNOSIS — G518 Other disorders of facial nerve: Secondary | ICD-10-CM | POA: Diagnosis not present

## 2024-02-07 DIAGNOSIS — G43719 Chronic migraine without aura, intractable, without status migrainosus: Secondary | ICD-10-CM | POA: Diagnosis not present

## 2024-02-07 DIAGNOSIS — M542 Cervicalgia: Secondary | ICD-10-CM | POA: Diagnosis not present

## 2024-02-07 DIAGNOSIS — M791 Myalgia, unspecified site: Secondary | ICD-10-CM | POA: Diagnosis not present

## 2024-02-21 DIAGNOSIS — M898X9 Other specified disorders of bone, unspecified site: Secondary | ICD-10-CM | POA: Diagnosis not present

## 2024-02-28 DIAGNOSIS — K080 Exfoliation of teeth due to systemic causes: Secondary | ICD-10-CM | POA: Diagnosis not present

## 2024-03-06 DIAGNOSIS — M791 Myalgia, unspecified site: Secondary | ICD-10-CM | POA: Diagnosis not present

## 2024-03-06 DIAGNOSIS — G43719 Chronic migraine without aura, intractable, without status migrainosus: Secondary | ICD-10-CM | POA: Diagnosis not present

## 2024-03-06 DIAGNOSIS — M542 Cervicalgia: Secondary | ICD-10-CM | POA: Diagnosis not present

## 2024-03-06 DIAGNOSIS — G518 Other disorders of facial nerve: Secondary | ICD-10-CM | POA: Diagnosis not present

## 2024-03-15 DIAGNOSIS — H5213 Myopia, bilateral: Secondary | ICD-10-CM | POA: Diagnosis not present

## 2024-04-04 DIAGNOSIS — G43719 Chronic migraine without aura, intractable, without status migrainosus: Secondary | ICD-10-CM | POA: Diagnosis not present

## 2024-04-04 DIAGNOSIS — M542 Cervicalgia: Secondary | ICD-10-CM | POA: Diagnosis not present

## 2024-04-04 DIAGNOSIS — M791 Myalgia, unspecified site: Secondary | ICD-10-CM | POA: Diagnosis not present

## 2024-05-08 DIAGNOSIS — G43719 Chronic migraine without aura, intractable, without status migrainosus: Secondary | ICD-10-CM | POA: Diagnosis not present

## 2024-05-08 DIAGNOSIS — M542 Cervicalgia: Secondary | ICD-10-CM | POA: Diagnosis not present

## 2024-05-08 DIAGNOSIS — M791 Myalgia, unspecified site: Secondary | ICD-10-CM | POA: Diagnosis not present

## 2024-05-08 DIAGNOSIS — G518 Other disorders of facial nerve: Secondary | ICD-10-CM | POA: Diagnosis not present

## 2024-06-19 DIAGNOSIS — Z0000 Encounter for general adult medical examination without abnormal findings: Secondary | ICD-10-CM | POA: Diagnosis not present

## 2024-06-19 DIAGNOSIS — E7800 Pure hypercholesterolemia, unspecified: Secondary | ICD-10-CM | POA: Diagnosis not present

## 2024-06-19 DIAGNOSIS — Z125 Encounter for screening for malignant neoplasm of prostate: Secondary | ICD-10-CM | POA: Diagnosis not present

## 2024-06-19 DIAGNOSIS — E039 Hypothyroidism, unspecified: Secondary | ICD-10-CM | POA: Diagnosis not present

## 2024-06-19 DIAGNOSIS — Z23 Encounter for immunization: Secondary | ICD-10-CM | POA: Diagnosis not present

## 2024-06-20 DIAGNOSIS — M542 Cervicalgia: Secondary | ICD-10-CM | POA: Diagnosis not present

## 2024-06-20 DIAGNOSIS — G43719 Chronic migraine without aura, intractable, without status migrainosus: Secondary | ICD-10-CM | POA: Diagnosis not present

## 2024-06-20 DIAGNOSIS — G518 Other disorders of facial nerve: Secondary | ICD-10-CM | POA: Diagnosis not present

## 2024-06-20 DIAGNOSIS — M791 Myalgia, unspecified site: Secondary | ICD-10-CM | POA: Diagnosis not present

## 2024-06-26 DIAGNOSIS — I10 Essential (primary) hypertension: Secondary | ICD-10-CM | POA: Diagnosis not present

## 2024-06-26 DIAGNOSIS — I2510 Atherosclerotic heart disease of native coronary artery without angina pectoris: Secondary | ICD-10-CM | POA: Diagnosis not present

## 2024-06-26 DIAGNOSIS — E7800 Pure hypercholesterolemia, unspecified: Secondary | ICD-10-CM | POA: Diagnosis not present

## 2024-06-26 DIAGNOSIS — E559 Vitamin D deficiency, unspecified: Secondary | ICD-10-CM | POA: Diagnosis not present

## 2024-06-26 DIAGNOSIS — Z0000 Encounter for general adult medical examination without abnormal findings: Secondary | ICD-10-CM | POA: Diagnosis not present

## 2024-07-01 DIAGNOSIS — L218 Other seborrheic dermatitis: Secondary | ICD-10-CM | POA: Diagnosis not present

## 2024-07-01 DIAGNOSIS — L821 Other seborrheic keratosis: Secondary | ICD-10-CM | POA: Diagnosis not present

## 2024-07-01 DIAGNOSIS — D225 Melanocytic nevi of trunk: Secondary | ICD-10-CM | POA: Diagnosis not present

## 2024-07-01 DIAGNOSIS — L738 Other specified follicular disorders: Secondary | ICD-10-CM | POA: Diagnosis not present

## 2024-07-30 DIAGNOSIS — M791 Myalgia, unspecified site: Secondary | ICD-10-CM | POA: Diagnosis not present

## 2024-07-30 DIAGNOSIS — G518 Other disorders of facial nerve: Secondary | ICD-10-CM | POA: Diagnosis not present

## 2024-07-30 DIAGNOSIS — M542 Cervicalgia: Secondary | ICD-10-CM | POA: Diagnosis not present

## 2024-07-30 DIAGNOSIS — G43719 Chronic migraine without aura, intractable, without status migrainosus: Secondary | ICD-10-CM | POA: Diagnosis not present

## 2024-08-06 DIAGNOSIS — Z860100 Personal history of colon polyps, unspecified: Secondary | ICD-10-CM | POA: Diagnosis not present

## 2024-08-06 DIAGNOSIS — Z1211 Encounter for screening for malignant neoplasm of colon: Secondary | ICD-10-CM | POA: Diagnosis not present

## 2024-08-06 DIAGNOSIS — R634 Abnormal weight loss: Secondary | ICD-10-CM | POA: Diagnosis not present

## 2024-08-09 DIAGNOSIS — I2510 Atherosclerotic heart disease of native coronary artery without angina pectoris: Secondary | ICD-10-CM | POA: Diagnosis not present

## 2024-08-09 DIAGNOSIS — E7800 Pure hypercholesterolemia, unspecified: Secondary | ICD-10-CM | POA: Diagnosis not present

## 2024-08-26 DIAGNOSIS — I2510 Atherosclerotic heart disease of native coronary artery without angina pectoris: Secondary | ICD-10-CM | POA: Diagnosis not present

## 2024-09-13 DIAGNOSIS — K635 Polyp of colon: Secondary | ICD-10-CM | POA: Diagnosis not present

## 2024-09-13 DIAGNOSIS — Z860100 Personal history of colon polyps, unspecified: Secondary | ICD-10-CM | POA: Diagnosis not present

## 2024-09-13 DIAGNOSIS — Z1211 Encounter for screening for malignant neoplasm of colon: Secondary | ICD-10-CM | POA: Diagnosis not present
